# Patient Record
Sex: Female | Born: 2000 | Race: White | Hispanic: No | Marital: Single | Smoking: Never smoker
Health system: Southern US, Community
[De-identification: ages and names within clinical notes are randomized; demographics above are authoritative.]

## PROBLEM LIST (undated history)

## (undated) DIAGNOSIS — F419 Anxiety disorder, unspecified: Secondary | ICD-10-CM

## (undated) DIAGNOSIS — F32A Depression, unspecified: Secondary | ICD-10-CM

## (undated) DIAGNOSIS — F329 Major depressive disorder, single episode, unspecified: Secondary | ICD-10-CM

## (undated) DIAGNOSIS — Z9109 Other allergy status, other than to drugs and biological substances: Secondary | ICD-10-CM

## (undated) DIAGNOSIS — F909 Attention-deficit hyperactivity disorder, unspecified type: Secondary | ICD-10-CM

## (undated) HISTORY — DX: Anxiety disorder, unspecified: F41.9

## (undated) HISTORY — DX: Major depressive disorder, single episode, unspecified: F32.9

## (undated) HISTORY — DX: Depression, unspecified: F32.A

---
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
## 2018-02-02 ENCOUNTER — Ambulatory Visit (INDEPENDENT_AMBULATORY_CARE_PROVIDER_SITE_OTHER): Payer: 59 | Admitting: Physician Assistant

## 2018-02-02 VITALS — BP 101/59 | HR 86

## 2018-02-02 DIAGNOSIS — Z3042 Encounter for surveillance of injectable contraceptive: Secondary | ICD-10-CM

## 2018-02-02 MED ORDER — ESTROGENS CONJUGATED 1.25 MG PO TABS: ORAL_TABLET | ORAL | 0 refills | Status: DC

## 2018-02-02 MED ORDER — MEDROXYPROGESTERONE ACETATE 150 MG/ML IM SUSP
150.0000 mg | Freq: Once | INTRAMUSCULAR | Status: AC
  Administered 2018-02-02: 150 mg via INTRAMUSCULAR

## 2018-02-02 MED FILL — PREMARIN 1.25 MG TABLET: 1.25 | 10 days supply | Qty: 40 | Fill #0

## 2018-02-02 NOTE — Progress Notes (Signed)
Pt came into clinic today for depo injection, she is within administration window. Reports over the last month she has had 2 episodes of menstruation, she is currently bleeding now. Requesting something to help stop her period because she is leaving on a cruise tomorrow. Provider will send over estrogen Rx. Pt tolerated injection in LUOQ well, no immediate complications. Pt BP is also low. Provided water and let her sit for 5 min after injection. Pt is asymptomatic so advised to leave clinic and go get something salty to eat and a gatorade. Verbalized understanding. Print out of next vaccines dates provided. No further questions.   Agree with above plan. Follow up with PCP if bleeding persists. Tandy GawJade Breeback PA-C

## 2018-02-06 ENCOUNTER — Ambulatory Visit: Payer: 59

## 2018-03-29 MED FILL — VENTOLIN HFA 90 MCG INHALER: 108 (90 BAS | 17 days supply | Qty: 18 | Fill #1

## 2018-04-13 ENCOUNTER — Ambulatory Visit: Payer: 59

## 2018-04-20 ENCOUNTER — Ambulatory Visit: Payer: 59

## 2018-04-30 ENCOUNTER — Telehealth: Payer: Self-pay | Admitting: Family Medicine

## 2018-04-30 ENCOUNTER — Ambulatory Visit (INDEPENDENT_AMBULATORY_CARE_PROVIDER_SITE_OTHER): Payer: 59 | Admitting: Family Medicine

## 2018-04-30 VITALS — BP 105/55 | HR 72 | Temp 98.1°F

## 2018-04-30 DIAGNOSIS — Z3042 Encounter for surveillance of injectable contraceptive: Secondary | ICD-10-CM

## 2018-04-30 DIAGNOSIS — Z23 Encounter for immunization: Secondary | ICD-10-CM | POA: Diagnosis not present

## 2018-04-30 MED ORDER — MEDROXYPROGESTERONE ACETATE 150 MG/ML IM SUSP
150.0000 mg | Freq: Once | INTRAMUSCULAR | Status: AC
  Administered 2018-04-30: 150 mg via INTRAMUSCULAR

## 2018-04-30 MED ORDER — ALBUTEROL SULFATE 108 (90 BASE) MCG/ACT IN AEPB: 2.0000 | INHALATION_SPRAY | RESPIRATORY_TRACT | 1 refills | Status: DC | PRN

## 2018-04-30 MED ORDER — LISDEXAMFETAMINE DIMESYLATE 40 MG PO CAPS: 40.0000 mg | ORAL_CAPSULE | ORAL | 0 refills | Status: DC

## 2018-04-30 MED FILL — VENTOLIN HFA 90 MCG INHALER: 108 (90 BAS | 17 days supply | Qty: 18 | Fill #2

## 2018-04-30 NOTE — Progress Notes (Signed)
Pt's mother advised. 

## 2018-04-30 NOTE — Telephone Encounter (Signed)
Received fax from [employer] that Vyvanse requires a PA. Information has been sent to the insurance company. Awaiting determination.   

## 2018-04-30 NOTE — Progress Notes (Signed)
I will go ahead and refill vyvansse since BP ok today. Will need to F/U in Dec when home for break.

## 2018-04-30 NOTE — Progress Notes (Signed)
Pt came into clinic today for depo injection, she is within administration window. Reports no negative side effects. Pt tolerated depo injection in LUOQ well, no immediate complications. Pt tolerated flu shot in left deltoid well, no immediate complications. Pt BP is low, as it has been the last 3 office visits. Pt is asymptomatic so advised to leave clinic and go get something salty to eat and a gatorade. Verbalized understanding. Print out of next vaccines dates provided.   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 She took a year off school last year, so she stopped taking her Vyvanse. She now would like to restart it. Also requesting a refill on her inhaler. Routing to PCP.

## 2018-05-03 NOTE — Telephone Encounter (Signed)
Call pt: Just verify that she is okay with this.  Just do not want her to be surprised when tshe gets her medication.

## 2018-05-03 NOTE — Telephone Encounter (Signed)
Received fax from [employer] that Vyvanse is not covered and patient will have to try and fail Adderall or Adderall XR. Please advise.

## 2018-05-03 NOTE — Telephone Encounter (Signed)
Left message for patient to call back about medication change.  

## 2018-05-04 NOTE — Telephone Encounter (Signed)
Left brief VM for patient to call back about medication change.

## 2018-05-07 NOTE — Telephone Encounter (Signed)
MyChart message sent to the patient and waiting on response from patient since I have not received a call back.

## 2018-05-14 NOTE — Telephone Encounter (Signed)
Left vm informing pt to rtn call about taking Adderall or Adderall XR. Or she could respond via my chart.Marland KitchenMarland KitchenHeath Gold, CMA

## 2018-06-07 ENCOUNTER — Telehealth: Payer: 59 | Admitting: Physician Assistant

## 2018-06-07 DIAGNOSIS — J45909 Unspecified asthma, uncomplicated: Secondary | ICD-10-CM

## 2018-06-07 MED ORDER — ALBUTEROL SULFATE 108 (90 BASE) MCG/ACT IN AEPB: 2.0000 | INHALATION_SPRAY | RESPIRATORY_TRACT | 0 refills | Status: DC | PRN

## 2018-06-07 MED ORDER — BENZONATATE 100 MG PO CAPS: 100.0000 mg | ORAL_CAPSULE | Freq: Three times a day (TID) | ORAL | 0 refills | Status: DC | PRN

## 2018-06-07 MED ORDER — PREDNISONE 10 MG PO TABS
10.0000 mg | ORAL_TABLET | Freq: Every day | ORAL | 0 refills | Status: DC
Start: 2018-06-07 — End: 2018-07-12

## 2018-06-07 MED ORDER — BECLOMETHASONE DIPROPIONATE 40 MCG/ACT IN AERS: 2.0000 | INHALATION_SPRAY | Freq: Two times a day (BID) | RESPIRATORY_TRACT | 0 refills | Status: DC

## 2018-06-07 NOTE — Progress Notes (Signed)

## 2018-07-12 ENCOUNTER — Telehealth: Payer: Self-pay

## 2018-07-12 MED ORDER — AMPHETAMINE-DEXTROAMPHET ER 30 MG PO CP24: 30.0000 mg | ORAL_CAPSULE | Freq: Every day | ORAL | 0 refills | Status: DC

## 2018-07-12 MED FILL — ADDERALL XR 30 MG CAP SA: 30 | 30 days supply | Qty: 30 | Fill #0

## 2018-07-12 NOTE — Telephone Encounter (Signed)
New prescription for extended release Adderall 30 mg is sent to [facility].  This is actually pretty close to 40 mg of Vyvanse even though the milligrams are different.  She can try it for a month and let me know if this is working and helpful.  She still prefers the Vyvanse and she may want to speak with her insurance company to see if she is able to request a exemption.

## 2018-07-12 NOTE — Telephone Encounter (Signed)
Vyvanse is not covered by insurance. She has to be on Adderall first. Patient agreed to try the Adderall. Please advise.

## 2018-07-12 NOTE — Telephone Encounter (Signed)
lvm w/recommendations. Advised to call back if any questions.[name].[name].Shannon PacasBarkley, Vallery Mcdade KekoskeeLynetta

## 2018-07-16 ENCOUNTER — Ambulatory Visit: Payer: 59

## 2018-07-18 MED FILL — PROAIR RESPICLICK INHAL PWD: 108 (90 BAS | 17 days supply | Qty: 1 | Fill #0

## 2018-07-31 ENCOUNTER — Ambulatory Visit (INDEPENDENT_AMBULATORY_CARE_PROVIDER_SITE_OTHER): Payer: 59 | Admitting: Family Medicine

## 2018-07-31 VITALS — BP 106/64 | HR 73 | Temp 98.2°F | Wt 130.0 lb

## 2018-07-31 DIAGNOSIS — Z3042 Encounter for surveillance of injectable contraceptive: Secondary | ICD-10-CM

## 2018-07-31 LAB — POCT URINE PREGNANCY: Preg Test, Ur: NEGATIVE

## 2018-07-31 MED ORDER — MEDROXYPROGESTERONE ACETATE 150 MG/ML IM SUSP
150.0000 mg | Freq: Once | INTRAMUSCULAR | Status: AC
  Administered 2018-07-31: 150 mg via INTRAMUSCULAR

## 2018-07-31 NOTE — Progress Notes (Signed)
Pt is here for a Depo Provera injection. Denies chest pain, shortness of breath, headaches, mood changes or problems with medication. Pt is not within shot window for administration so Urine HCG performed and was negative.Pt.tolerated injection in  LUOQ well, no immediate complications. Printed copy of next injection window provided. No further questions or concerns.

## 2018-07-31 NOTE — Progress Notes (Signed)
Agree with documentation as above.  Need to get an up-to-date urine GC chlamydia on her as well.  Nani Gasser, MD

## 2018-07-31 NOTE — Progress Notes (Signed)
Urine was discarded, will order at next visit.

## 2018-08-16 MED FILL — PROAIR RESPICLICK INHAL PWD: 108 (90 BAS | 17 days supply | Qty: 1 | Fill #1

## 2018-10-10 ENCOUNTER — Other Ambulatory Visit: Payer: Self-pay | Admitting: Family Medicine

## 2018-10-11 MED FILL — PROAIR RESPICLICK INHAL PWD: 108 (90 BAS | 17 days supply | Qty: 1 | Fill #0

## 2018-10-23 ENCOUNTER — Ambulatory Visit (INDEPENDENT_AMBULATORY_CARE_PROVIDER_SITE_OTHER): Payer: 59 | Admitting: Osteopathic Medicine

## 2018-10-23 VITALS — BP 115/55 | HR 84

## 2018-10-23 DIAGNOSIS — Z3042 Encounter for surveillance of injectable contraceptive: Secondary | ICD-10-CM

## 2018-10-23 MED ORDER — MEDROXYPROGESTERONE ACETATE 150 MG/ML IM SUSP
150.0000 mg | Freq: Once | INTRAMUSCULAR | Status: AC
  Administered 2018-10-23: 09:00:00 150 mg via INTRAMUSCULAR

## 2018-10-23 NOTE — Progress Notes (Signed)
Established Patient Office Visit  Subjective:  Patient ID: Shannon Bishop, female    DOB: [birth year]  Age: 18 y.o. MRN: [id number]  CC:  Chief Complaint  Patient presents with  . Contraception    HPI Shannon Bishop is here for a Depo Provera injection. Denies chest pain, shortness of breath, headaches, mood changes or problems with medication. It has been < 14 weeks since her last Depo Provera injection.   Past Medical History:  Diagnosis Date  . ADHD (attention deficit hyperactivity disorder)   . Anxiety   . Asthma   . Depression   . Environmental allergies     No past surgical history on file.  Family History  Problem Relation Age of Onset  . Asthma Mother   . Irritable bowel syndrome Mother   . Hypertension Maternal Grandmother   . Heart disease Maternal Grandmother   . Cancer Maternal Grandmother   . Irritable bowel syndrome Sister     Social History   Socioeconomic History  . Marital status: Single    Spouse name: Not on file  . Number of children: Not on file  . Years of education: Not on file  . Highest education level: Not on file  Occupational History  . Not on file  Social Needs  . Financial resource strain: Not on file  . Food insecurity:    Worry: Not on file    Inability: Not on file  . Transportation needs:    Medical: Not on file    Non-medical: Not on file  Tobacco Use  . Smoking status: Never Smoker  . Smokeless tobacco: Never Used  Substance and Sexual Activity  . Alcohol use: No  . Drug use: No  . Sexual activity: Yes    Birth control/protection: Pill  Lifestyle  . Physical activity:    Days per week: Not on file    Minutes per session: Not on file  . Stress: Not on file  Relationships  . Social connections:    Talks on phone: Not on file    Gets together: Not on file    Attends religious service: Not on file    Active member of club or organization: Not on file    Attends meetings of clubs or organizations: Not on file     Relationship status: Not on file  . Intimate partner violence:    Fear of current or ex partner: Not on file    Emotionally abused: Not on file    Physically abused: Not on file    Forced sexual activity: Not on file  Other Topics Concern  . Not on file  Social History Narrative  . Not on file    Outpatient Medications Prior to Visit  Medication Sig Dispense Refill  . albuterol (PROVENTIL) (2.5 MG/3ML) 0.083% nebulizer solution Take 3 mLs (2.5 mg total) by nebulization every 4 (four) hours as needed for wheezing or shortness of breath (please include nebulizer machine, hoses, and mask if needed.). 30 vial 0  . amphetamine-dextroamphetamine (ADDERALL XR) 30 MG 24 hr capsule Take 1 capsule (30 mg total) by mouth daily. 30 capsule 0  . beclomethasone (QVAR) 40 MCG/ACT inhaler Inhale 2 puffs into the lungs 2 (two) times daily. 1 Inhaler 0  . lisdexamfetamine (VYVANSE) 40 MG capsule Take 1 capsule (40 mg total) by mouth every morning. 30 capsule 0  . Melatonin 5 MG TABS Take 1 tablet by mouth at bedtime as needed (sleep).    [name] PROAIR RESPICLICK  108 (90 Base) MCG/ACT AEPB INHALE 2 PUFFS INTO THE LUNGS EVERY 4 (FOUR) HOURS AS NEEDED. 1 each 1   No facility-administered medications prior to visit.     Allergies  Allergen Reactions  . Lexapro [Escitalopram Oxalate] Nausea And Vomiting  . Amoxicillin Rash  . Trintellix [Vortioxetine] Nausea And Vomiting    ROS Review of Systems    Objective:    Physical Exam  BP (!) 115/55   Pulse 84   SpO2 100%  Wt Readings from Last 3 Encounters:  07/31/18 130 lb (59 kg)  12/23/17 120 lb (54.4 kg) (35 %, Z= -0.38)*  11/21/17 126 lb (57.2 kg) (48 %, Z= -0.06)*   * Growth percentiles are based on CDC (Girls, 2-20 Years) data.     Health Maintenance Due  Topic Date Due  . HIV Screening  07/18/2013  . CHLAMYDIA SCREENING  05/30/2018    There are no preventive care reminders to display for this patient.  Lab Results  Component Value  Date   TSH 1.34 11/26/2015   Lab Results  Component Value Date   WBC 13.8 (H) 12/23/2017   HGB 14.7 12/23/2017   HCT 40.4 12/23/2017   MCV 88.2 12/23/2017   PLT 334 12/23/2017   Lab Results  Component Value Date   NA 141 12/23/2017   K 4.3 12/23/2017   CO2 23 12/23/2017   GLUCOSE 98 12/23/2017   BUN 13 12/23/2017   CREATININE 0.83 12/23/2017   BILITOT 1.1 12/23/2017   ALKPHOS 60 12/23/2017   AST 26 12/23/2017   ALT 17 12/23/2017   PROT 8.1 12/23/2017   ALBUMIN 5.0 12/23/2017   CALCIUM 9.6 12/23/2017   ANIONGAP 11 12/23/2017   No results found for: CHOL No results found for: HDL No results found for: LDLCALC No results found for: TRIG No results found for: CHOLHDL No results found for: [id number]    Assessment & Plan:  Contraceptive - Patient tolerated injection well without complications. Patient advised to schedule next injection 12 weeks from today.   Problem List Items Addressed This Visit    None    Visit Diagnoses    Surveillance of contraceptive injection    -  Primary   Relevant Medications   medroxyPROGESTERone (DEPO-PROVERA) injection 150 mg (Completed) (Start on 10/23/2018  9:15 AM)      Meds ordered this encounter  Medications  . medroxyPROGESTERone (DEPO-PROVERA) injection 150 mg    Follow-up: Return in about 12 weeks (around 01/15/2019) for Depo Provera injection. Earna Coder.    Meklit Cotta, Janalyn HarderAngela Hale, CMA

## 2018-12-03 ENCOUNTER — Telehealth: Payer: Self-pay

## 2018-12-03 ENCOUNTER — Encounter: Payer: Self-pay | Admitting: Family Medicine

## 2018-12-03 NOTE — Telephone Encounter (Signed)
Shannon Bishop called and reports close contact with someone who tested positive for the COVID-19. (Her boyfriend) Patient has no symptoms at the moment. Patient advised to self quarantine for 48 hours. If symptoms such as cough, shortness of breath and fever give [place] a call back.

## 2018-12-03 NOTE — Telephone Encounter (Signed)
Agree with above 

## 2018-12-04 ENCOUNTER — Telehealth (INDEPENDENT_AMBULATORY_CARE_PROVIDER_SITE_OTHER): Payer: 59 | Admitting: Family Medicine

## 2018-12-04 ENCOUNTER — Telehealth: Payer: Self-pay | Admitting: Family Medicine

## 2018-12-04 ENCOUNTER — Telehealth: Payer: 59 | Admitting: Family Medicine

## 2018-12-04 ENCOUNTER — Other Ambulatory Visit: Payer: 59

## 2018-12-04 ENCOUNTER — Telehealth: Payer: Self-pay | Admitting: *Deleted

## 2018-12-04 ENCOUNTER — Other Ambulatory Visit: Payer: Self-pay

## 2018-12-04 ENCOUNTER — Encounter: Payer: Self-pay | Admitting: Family Medicine

## 2018-12-04 VITALS — Temp 98.7°F | Wt 126.0 lb

## 2018-12-04 DIAGNOSIS — Z20822 Contact with and (suspected) exposure to covid-19: Secondary | ICD-10-CM

## 2018-12-04 DIAGNOSIS — J4540 Moderate persistent asthma, uncomplicated: Secondary | ICD-10-CM | POA: Diagnosis not present

## 2018-12-04 DIAGNOSIS — R6889 Other general symptoms and signs: Secondary | ICD-10-CM | POA: Diagnosis not present

## 2018-12-04 MED ORDER — ALBUTEROL SULFATE HFA 108 (90 BASE) MCG/ACT IN AERS: 2.0000 | INHALATION_SPRAY | Freq: Four times a day (QID) | RESPIRATORY_TRACT | 2 refills | Status: DC | PRN

## 2018-12-04 MED FILL — ALBUTEROL SULFATE HFA 108 (: 108 (90 BAS | 25 days supply | Qty: 18 | Fill #0

## 2018-12-04 NOTE — Progress Notes (Signed)
Virtual Visit via Video Note  I connected with Shannon Bishop on 12/04/18 at 11:30 AM EDT by a video enabled telemedicine application and verified that I am speaking with the correct person using two identifiers.   I discussed the limitations of evaluation and management by telemedicine and the availability of in person appointments. The patient expressed understanding and agreed to proceed.  Pt was at home and I was in my office for the virtual visit.      Subjective:    CC:   HPI:          Possible COVID-19 exposure. Her boyfriend was tested and got his results back and was positive on yesterday. And she was last with him on Thursday of last week.  She reports dry cough, runny nose and sore throat x 1 day that are progressively getting worse. She has been taking IBU and tylenol for her sxs only 1 day. + frontal HA.  Denies any nausea or diarrhea, no spikes in her temperature. + bodyaches and mild congestion. No oss of smell. She has had some wheezing and shortness of breath.   She has been quarantining since Friday.                Past medical history, Surgical history, Family history not pertinant except as noted below, Social history, Allergies, and medications have been entered into the medical record, reviewed, and corrections made.   Review of Systems: No fevers, chills, night sweats, weight loss, chest pain, or shortness of breath.   Objective:    General: Speaking clearly in complete sentences without any shortness of breath.  Alert and oriented x3.  Normal judgment. No apparent acute distress. Sitting in bed.     Impression and Recommendations:   Covid suspect -try to see if we can get her tested today.  She is young and otherwise healthy but does have asthma which does increase her risk. she is self quarantining currently.  Discussed that she will need to self quarantine the most 7 days.  She can call back if she feels like she is getting worse or  developing any new symptoms.  I am also going to send over the COVID logging system through my chart to track her symptoms.  Encouraged her to stay well-hydrated.       I discussed the assessment and treatment plan with the patient. The patient was provided an opportunity to ask questions and all were answered. The patient agreed with the plan and demonstrated an understanding of the instructions.   The patient was advised to call back or seek an in-person evaluation if the symptoms worsen or if the condition fails to improve as anticipated.   Nani Gasser, MD

## 2018-12-04 NOTE — Telephone Encounter (Signed)
Contacted both numbers on file and person who anwsered said they were on a business call and could not talk right now, when asked if this was or if they knew a Wynelle Fanny, they said "No you have the wrong number".

## 2018-12-04 NOTE — Telephone Encounter (Signed)
-----   Message from Agapito Games, MD sent at 12/04/2018 11:55 AM EDT ----- Regarding: test for COVID Please schedule patient for community testing.  She was around her boyfriend last week who is just tested positive for COVID and she is now symptomatic herself.  She does have underlying asthma.

## 2018-12-04 NOTE — Progress Notes (Signed)
Possible COVID-19 exposure. Her boyfriend was tested and got his results back and was positive on yesterday. And she was last with him on Thursday of last week.  She reports cough, runny nose and cough x 1 day that are progressively getting worse. She has been taking IBU and tylenol for her sxs only 1 day. Denies any body aches nausea or diarrhea, no spikes in her temperature.   She has been quarantining since Friday.

## 2018-12-04 NOTE — Telephone Encounter (Signed)
Got correct number and fixed it on patients chart. No further questions at this time.

## 2018-12-04 NOTE — Telephone Encounter (Signed)
Patient called and scheduled for testing at 1:45pm on 12/04/18 at the [facility] site. Pt advised to wear a mask and remain in car for appt. Understanding verbalized.

## 2018-12-05 LAB — NOVEL CORONAVIRUS, NAA: SARS-CoV-2, NAA: NOT DETECTED

## 2018-12-10 ENCOUNTER — Encounter: Payer: Self-pay | Admitting: Family Medicine

## 2018-12-10 DIAGNOSIS — F331 Major depressive disorder, recurrent, moderate: Secondary | ICD-10-CM

## 2018-12-11 MED ORDER — ESCITALOPRAM OXALATE 10 MG PO TABS
ORAL_TABLET | ORAL | 0 refills | Status: DC
Start: 2018-12-11 — End: 2019-01-08

## 2018-12-11 MED FILL — ESCITALOPRAM 10 MG TABLET: 10 | 30 days supply | Qty: 30 | Fill #0

## 2019-01-08 ENCOUNTER — Ambulatory Visit (INDEPENDENT_AMBULATORY_CARE_PROVIDER_SITE_OTHER): Payer: 59 | Admitting: Family Medicine

## 2019-01-08 VITALS — BP 106/62 | HR 68 | Temp 97.6°F | Ht 62.0 in | Wt 130.0 lb

## 2019-01-08 DIAGNOSIS — Z3042 Encounter for surveillance of injectable contraceptive: Secondary | ICD-10-CM | POA: Diagnosis not present

## 2019-01-08 DIAGNOSIS — F331 Major depressive disorder, recurrent, moderate: Secondary | ICD-10-CM

## 2019-01-08 MED ORDER — QVAR 40 MCG/ACT IN AERS: 2.0000 | INHALATION_SPRAY | Freq: Two times a day (BID) | RESPIRATORY_TRACT | 1 refills | Status: DC

## 2019-01-08 MED ORDER — ESCITALOPRAM OXALATE 10 MG PO TABS: ORAL_TABLET | ORAL | 1 refills | Status: DC

## 2019-01-08 MED ORDER — MEDROXYPROGESTERONE ACETATE 150 MG/ML IM SUSP
150.0000 mg | Freq: Once | INTRAMUSCULAR | Status: AC
  Administered 2019-01-08: 150 mg via INTRAMUSCULAR

## 2019-01-08 MED FILL — ESCITALOPRAM 10 MG TABLET: 10 | 30 days supply | Qty: 30 | Fill #0

## 2019-01-08 MED FILL — QVAR REDIHALER 40 MCG/ACT A: 40 | 30 days supply | Qty: 11 | Fill #0

## 2019-01-08 NOTE — Progress Notes (Signed)
Patient presents to clinic for Depo Provera injection less than 12 weeks since the last injection. Patient denies chest pain, shortness of breath, headaches, mood changes or any other problems associated with the medication. Patient was given the injection in [place]. This is the correct placement.  Patient was given a handout and asked to schedule in 12 weeks from today. No other inquiries during the visit.

## 2019-01-14 MED FILL — ALBUTEROL SULFATE HFA 108 (: 108 (90 BAS | 25 days supply | Qty: 18 | Fill #1

## 2019-02-04 MED FILL — ALBUTEROL SULFATE HFA 108 (: 108 (90 BAS | 25 days supply | Qty: 18 | Fill #2

## 2019-02-22 ENCOUNTER — Encounter: Payer: Self-pay | Admitting: Family Medicine

## 2019-02-22 DIAGNOSIS — F331 Major depressive disorder, recurrent, moderate: Secondary | ICD-10-CM

## 2019-02-22 MED ORDER — ESCITALOPRAM OXALATE 20 MG PO TABS: 20.0000 mg | ORAL_TABLET | Freq: Every day | ORAL | 0 refills | Status: DC

## 2019-02-25 MED FILL — ESCITALOPRAM 20 MG TABLET: 20 | 90 days supply | Qty: 90 | Fill #0

## 2019-03-13 ENCOUNTER — Ambulatory Visit (INDEPENDENT_AMBULATORY_CARE_PROVIDER_SITE_OTHER): Payer: 59 | Admitting: Physician Assistant

## 2019-03-13 ENCOUNTER — Other Ambulatory Visit: Payer: Self-pay

## 2019-03-13 ENCOUNTER — Encounter: Payer: Self-pay | Admitting: Physician Assistant

## 2019-03-13 VITALS — BP 107/58 | HR 83 | Ht 62.0 in | Wt 129.0 lb

## 2019-03-13 DIAGNOSIS — R21 Rash and other nonspecific skin eruption: Secondary | ICD-10-CM

## 2019-03-13 DIAGNOSIS — L299 Pruritus, unspecified: Secondary | ICD-10-CM | POA: Diagnosis not present

## 2019-03-13 MED ORDER — HYDROXYZINE PAMOATE 25 MG PO CAPS: 25.0000 mg | ORAL_CAPSULE | Freq: Four times a day (QID) | ORAL | 0 refills | Status: DC | PRN

## 2019-03-13 MED ORDER — PREDNISONE 20 MG PO TABS
ORAL_TABLET | ORAL | 0 refills | Status: DC
Start: 2019-03-13 — End: 2019-04-03

## 2019-03-13 MED ORDER — METHYLPREDNISOLONE SODIUM SUCC 125 MG IJ SOLR
125.0000 mg | Freq: Once | INTRAMUSCULAR | Status: AC
  Administered 2019-03-13: 125 mg via INTRAMUSCULAR

## 2019-03-13 MED FILL — predniSONE 20 MG TABS: 20 | 13 days supply | Qty: 20 | Fill #0

## 2019-03-13 MED FILL — HYDROXYZINE PAM 25 MG CAP: 25 | 15 days supply | Qty: 60 | Fill #0

## 2019-03-13 NOTE — Progress Notes (Signed)
Subjective:    Patient ID: Shannon Bishop, female    DOB: 2001-01-20, 18 y.o.   MRN: [id number]  HPI  Pt is a 18 yo female with asthma and seasonal allergies who presents to the clinic with rash on bilateral flank and lower abdomen and some on arms/elbows for the last week. She is not aware of any new medications or environments. She did just go up on her lexapro dosage about 2 weeks ago. The first time she start lexapro she also had n/v but she tried again and was better with that. She has not started using any new detergents and uses fragrence free detergent now. She is VERY itchy but no pain. No SOB, tongue swelling, lip swelling, problems swallowing. Showers seems to soothe for a little bit. Not taking any daily allergy medication.   .. Active Ambulatory Problems    Diagnosis Date Noted  . Chronic seasonal allergic rhinitis 03/15/2010  . ADHD (attention deficit hyperactivity disorder) 06/29/2012  . Major depression 11/26/2015  . Generalized anxiety disorder 01/07/2016  . Menorrhagia with regular cycle 05/10/2017  . Skin hypopigmentation 05/10/2017  . Asthma in adult, moderate persistent, uncomplicated 12/04/2018   Resolved Ambulatory Problems    Diagnosis Date Noted  . ASTHMA 03/15/2010  . PNEUMONIA, RIGHT 08/25/2010  . ADVERSE DRUG REACTION 08/25/2010  . Sprain, lateral patellar retinaculum of the left knee. 04/04/2012  . Asthma exacerbation 10/06/2013  . Right knee pain 06/09/2015  . Laceration, eyelid, left 07/27/2015  . Major depressive disorder, recurrent episode, moderate (HCC) 01/07/2016  . Viral upper respiratory infection 04/06/2016  . Rhomboid muscle pain 06/15/2016  . Abdominal pain 11/21/2016   Past Medical History:  Diagnosis Date  . Anxiety   . Asthma   . Depression   . Environmental allergies      Review of Systems See hPI.     Objective:   Physical Exam Vitals signs reviewed.  Constitutional:      Appearance: Normal appearance.  Cardiovascular:      Rate and Rhythm: Normal rate.     Pulses: Normal pulses.  Pulmonary:     Effort: Pulmonary effort is normal.     Breath sounds: Normal breath sounds. No wheezing.  Neurological:     General: No focal deficit present.     Mental Status: She is alert and oriented to person, place, and time.  Psychiatric:        Mood and Affect: Mood normal.          large erythematous base with defined border and appearance of wheals most promient on bilateral flank but on bilateral elbows, lower abdomen.   Assessment & Plan:  [name].[name].Shannon Bishop was seen today for rash.  Diagnoses and all orders for this visit:  Rash -     predniSONE (DELTASONE) 20 MG tablet; Take 3 tablets for 3 days, take 2 tablets for 3 days, take 1 tablet for 3 days, take 1/2 tablet for 4 days. -     hydrOXYzine (VISTARIL) 25 MG capsule; Take 1 capsule (25 mg total) by mouth every 6 (six) hours as needed. -     methylPREDNISolone sodium succinate (SOLU-MEDROL) 125 mg/2 mL injection 125 mg  Itching -     hydrOXYzine (VISTARIL) 25 MG capsule; Take 1 capsule (25 mg total) by mouth every 6 (six) hours as needed.   Pt did just go up on lexapro dose. Rash is certainly more widespread than what contact dermatitis usually appears like. I would stop lexapro. Given solumedrol  today. Prednisone taper to start. Vistaril for itching. Watch out for sedation.   Follow up in 1-2 weeks. Need to discuss going back on some anti-depressant once rash is figured out.

## 2019-03-13 NOTE — Patient Instructions (Signed)
Hives Hives are itchy, red, swollen areas on your skin. Hives can show up on any part of your body. Hives often fade within 24 hours (acute hives). New hives can show up after old ones fade. This can go on for many days or weeks (chronic hives). Hives do not spread from person to person (are not contagious). Hives are caused by your body's response to something that you are allergic to (allergen). These are sometimes called triggers. You can get hives right after being around a trigger, or hours later. What are the causes?  Allergies to foods.  Insect bites or stings.  Pollen.  Pets.  Latex.  Chemicals.  Spending time in sunlight, heat, or cold.  Exercise.  Stress.  Some medicines.  Viruses. This includes the common cold.  Infections caused by germs (bacteria).  Allergy shots.  Blood transfusions. Sometimes, the cause is not known. What increases the risk?  Being a woman.  Being allergic to foods such as: ? Citrus fruits. ? Milk. ? Eggs. ? Peanuts. ? Tree nuts. ? Shellfish.  Being allergic to: ? Medicines. ? Latex. ? Insects. ? Animals. ? Pollen. What are the signs or symptoms?   Raised, itchy, red or white bumps or patches on your skin. These areas may: ? Get large and swollen. ? Change in shape and location. ? Stand alone or connect to each other over a large area of skin. ? Sting or hurt. ? Turn white when pressed in the center (blanch). In very bad cases, your hands, feet, and face may also get swollen. This may happen if hives start deeper in your skin. How is this treated? Treatment for this condition depends on your symptoms. Treatment may include:  Using cool, wet cloths (cool compresses) or taking cool showers to stop the itching.  Medicines that help: ? Relieve itching (antihistamines). ? Reduce swelling (corticosteroids). ? Treat infection (antibiotics).  A medicine (omalizumab) that is given as a shot (injection). Your doctor may  prescribe this if you have hives that do not get better even after other treatments.  In very bad cases, you may need a shot of a medicine called epinephrineto prevent a life-threatening allergic reaction (anaphylaxis). Follow these instructions at home: Medicines  Take or apply over-the-counter and prescription medicines only as told by your doctor.  If you were prescribed an antibiotic medicine, use it as told by your doctor. Do not stop using it even if you start to feel better. Skin care  Apply cool, wet cloths to the hives.  Do not scratch your skin. Do not rub your skin. General instructions  Do not take hot showers or baths. This can make itching worse.  Do not wear tight clothes.  Use sunscreen and wear clothes that cover your skin when you are outside.  Avoid any triggers that cause your hives. Keep a journal to help track what causes your hives. Write down: ? What medicines you take. ? What you eat and drink. ? What products you use on your skin.  Keep all follow-up visits as told by your doctor. This is important. Contact a doctor if:  Your symptoms are not better with medicine.  Your joints hurt or are swollen. Get help right away if:  You have a fever.  You have pain in your belly (abdomen).  Your tongue or lips are swollen.  Your eyelids are swollen.  Your chest or throat feels tight.  You have trouble breathing or swallowing. These symptoms may be an emergency.   Do not wait to see if the symptoms will go away. Get medical help right away. Call your local emergency services (911 in the [place] Do not drive yourself to the hospital. Summary  Hives are itchy, red, swollen areas on your skin.  Treatment for this condition depends on your symptoms.  Avoid things that cause your hives. Keep a journal to help track what causes your hives.  Take and apply over-the-counter and prescription medicines only as told by your doctor.  Keep all follow-up visits  as told by your doctor. This is important. This information is not intended to replace advice given to you by your health care provider. Make sure you discuss any questions you have with your health care provider. Document Released: 03/29/2008 Document Revised: 01/03/2018 Document Reviewed: 01/03/2018 Elsevier Patient Education  2020 Elsevier Inc.  

## 2019-03-26 ENCOUNTER — Encounter: Payer: Self-pay | Admitting: Family Medicine

## 2019-03-26 ENCOUNTER — Other Ambulatory Visit: Payer: Self-pay

## 2019-03-26 ENCOUNTER — Ambulatory Visit (INDEPENDENT_AMBULATORY_CARE_PROVIDER_SITE_OTHER): Payer: 59 | Admitting: Family Medicine

## 2019-03-26 VITALS — BP 107/60 | HR 87 | Temp 98.4°F | Wt 137.1 lb

## 2019-03-26 DIAGNOSIS — Z3042 Encounter for surveillance of injectable contraceptive: Secondary | ICD-10-CM

## 2019-03-26 MED ORDER — MEDROXYPROGESTERONE ACETATE 150 MG/ML IM SUSP
150.0000 mg | Freq: Once | INTRAMUSCULAR | Status: AC
  Administered 2019-03-26: 150 mg via INTRAMUSCULAR

## 2019-03-26 NOTE — Progress Notes (Signed)
Pt is here for Depo Provera injection. Denies chest pain, SOB, headaches, mood changes or problems well. Injection administered in the LUOQ. Patient tolerated injection well without complications. Patient was advised to schedule next injection in 12 weeks (Depo Provera calendar handed to patient).

## 2019-03-26 NOTE — Progress Notes (Signed)
Agree with documentation as above.   Dwight Adamczak, MD  

## 2019-04-03 ENCOUNTER — Other Ambulatory Visit: Payer: Self-pay

## 2019-04-03 ENCOUNTER — Ambulatory Visit (INDEPENDENT_AMBULATORY_CARE_PROVIDER_SITE_OTHER): Payer: 59 | Admitting: Family Medicine

## 2019-04-03 ENCOUNTER — Encounter: Payer: Self-pay | Admitting: Family Medicine

## 2019-04-03 VITALS — BP 97/54 | HR 94 | Ht 62.0 in | Wt 130.0 lb

## 2019-04-03 DIAGNOSIS — Z23 Encounter for immunization: Secondary | ICD-10-CM | POA: Diagnosis not present

## 2019-04-03 DIAGNOSIS — R21 Rash and other nonspecific skin eruption: Secondary | ICD-10-CM

## 2019-04-03 MED ORDER — CETIRIZINE HCL 10 MG PO TABS: 10.0000 mg | ORAL_TABLET | Freq: Every day | ORAL | 2 refills | Status: DC

## 2019-04-03 MED ORDER — LORATADINE 10 MG PO TABS: 10.0000 mg | ORAL_TABLET | Freq: Every day | ORAL | 2 refills | Status: DC

## 2019-04-03 MED ORDER — TRIAMCINOLONE ACETONIDE 0.1 % EX CREA: 1.0000 "application " | TOPICAL_CREAM | Freq: Two times a day (BID) | CUTANEOUS | 1 refills | Status: DC

## 2019-04-03 MED FILL — TRIAMCINOLONE ACETONIDE 0.1: 0.1 | 30 days supply | Qty: 80 | Fill #0

## 2019-04-03 MED FILL — LORATADINE 10 MG TABLET: 10 | 100 days supply | Qty: 100 | Fill #0

## 2019-04-03 MED FILL — CETIRIZINE HCL 10 MG TABS: 10 | 100 days supply | Qty: 100 | Fill #0

## 2019-04-03 NOTE — Progress Notes (Signed)
Acute Office Visit  Subjective:    Patient ID: Shannon Bishop, female    DOB: 2000/10/18, 18 y.o.   MRN: [id number]  Chief Complaint  Patient presents with  . Rash    HPI Patient is in today for rash and itching skin.  She actually saw 1 of my partners on September 9.  She has not had any new environmental exposures or medications though the dose on her Lexapro was increased about 2 weeks prior to the rash breaking out.  She was told that it was likely hives and was given some oral prednisone as well as hydroxyzine and a Solu-Medrol injection.  She says the rash is not as red as it was but the skin still feels itchy.  Lexapro was discontinued. She has some some dry patches and scale in some of the areas that were red before.  Still no CP, SOB, etc.  She has been sing Cetaphil lotion.    Past Medical History:  Diagnosis Date  . ADHD (attention deficit hyperactivity disorder)   . Anxiety   . Asthma   . Depression   . Environmental allergies     History reviewed. No pertinent surgical history.  Family History  Problem Relation Age of Onset  . Asthma Mother   . Irritable bowel syndrome Mother   . Hypertension Maternal Grandmother   . Heart disease Maternal Grandmother   . Cancer Maternal Grandmother   . Irritable bowel syndrome Sister     Social History   Socioeconomic History  . Marital status: Single    Spouse name: Not on file  . Number of children: Not on file  . Years of education: Not on file  . Highest education level: Not on file  Occupational History  . Not on file  Social Needs  . Financial resource strain: Not on file  . Food insecurity    Worry: Not on file    Inability: Not on file  . Transportation needs    Medical: Not on file    Non-medical: Not on file  Tobacco Use  . Smoking status: Never Smoker  . Smokeless tobacco: Never Used  Substance and Sexual Activity  . Alcohol use: No  . Drug use: No  . Sexual activity: Yes    Birth  control/protection: Pill  Lifestyle  . Physical activity    Days per week: Not on file    Minutes per session: Not on file  . Stress: Not on file  Relationships  . Social Musicianconnections    Talks on phone: Not on file    Gets together: Not on file    Attends religious service: Not on file    Active member of club or organization: Not on file    Attends meetings of clubs or organizations: Not on file    Relationship status: Not on file  . Intimate partner violence    Fear of current or ex partner: Not on file    Emotionally abused: Not on file    Physically abused: Not on file    Forced sexual activity: Not on file  Other Topics Concern  . Not on file  Social History Narrative  . Not on file    Outpatient Medications Prior to Visit  Medication Sig Dispense Refill  . albuterol (PROVENTIL) (2.5 MG/3ML) 0.083% nebulizer solution Take 3 mLs (2.5 mg total) by nebulization every 4 (four) hours as needed for wheezing or shortness of breath (please include nebulizer machine, hoses, and mask if  needed.). 30 vial 0  . albuterol (VENTOLIN HFA) 108 (90 Base) MCG/ACT inhaler Inhale 2 puffs into the lungs every 6 (six) hours as needed for wheezing or shortness of breath. 1 Inhaler 2  . amphetamine-dextroamphetamine (ADDERALL XR) 30 MG 24 hr capsule Take 1 capsule (30 mg total) by mouth daily. 30 capsule 0  . beclomethasone (QVAR) 40 MCG/ACT inhaler Inhale 2 puffs into the lungs 2 (two) times daily. 1 Inhaler 1  . escitalopram (LEXAPRO) 20 MG tablet Take 1 tablet (20 mg total) by mouth daily. One half tab daily for a week then one tab by mouth daily (Patient not taking: Reported on 03/26/2019) 90 tablet 0  . hydrOXYzine (VISTARIL) 25 MG capsule Take 1 capsule (25 mg total) by mouth every 6 (six) hours as needed. 60 capsule 0  . predniSONE (DELTASONE) 20 MG tablet Take 3 tablets for 3 days, take 2 tablets for 3 days, take 1 tablet for 3 days, take 1/2 tablet for 4 days. 20 tablet 0   No  facility-administered medications prior to visit.     Allergies  Allergen Reactions  . Lexapro [Escitalopram Oxalate] Nausea And Vomiting  . Amoxicillin Rash  . Trintellix [Vortioxetine] Nausea And Vomiting    ROS     Objective:    Physical Exam  Constitutional: She is oriented to person, place, and time. She appears well-developed and well-nourished.  HENT:  Head: Normocephalic and atraumatic.  Eyes: Conjunctivae and EOM are normal.  Cardiovascular: Normal rate.  Pulmonary/Chest: Effort normal.  Neurological: She is alert and oriented to person, place, and time.  Skin: Skin is dry. No pallor.  She has some patches of find scale on her anterior abdomen, low back and posterior calves. No significant erythema. No major excoriations.   Psychiatric: She has a normal mood and affect. Her behavior is normal.  Vitals reviewed.   BP (!) 97/54   Pulse 94   Ht 5\' 2"  (1.575 m)   Wt 130 lb (59 kg)   SpO2 100%   BMI 23.78 kg/m  Wt Readings from Last 3 Encounters:  04/03/19 130 lb (59 kg)  03/26/19 137 lb 1.6 oz (62.2 kg)  03/13/19 129 lb (58.5 kg)    Health Maintenance Due  Topic Date Due  . INFLUENZA VACCINE  02/02/2019    There are no preventive care reminders to display for this patient.   Lab Results  Component Value Date   TSH 1.34 11/26/2015   Lab Results  Component Value Date   WBC 13.8 (H) 12/23/2017   HGB 14.7 12/23/2017   HCT 40.4 12/23/2017   MCV 88.2 12/23/2017   PLT 334 12/23/2017   Lab Results  Component Value Date   NA 141 12/23/2017   K 4.3 12/23/2017   CO2 23 12/23/2017   GLUCOSE 98 12/23/2017   BUN 13 12/23/2017   CREATININE 0.83 12/23/2017   BILITOT 1.1 12/23/2017   ALKPHOS 60 12/23/2017   AST 26 12/23/2017   ALT 17 12/23/2017   PROT 8.1 12/23/2017   ALBUMIN 5.0 12/23/2017   CALCIUM 9.6 12/23/2017   ANIONGAP 11 12/23/2017   No results found for: CHOL No results found for: HDL No results found for: LDLCALC No results found for:  TRIG No results found for: CHOLHDL No results found for: 12/25/2017     Assessment & Plan:   Problem List Items Addressed This Visit    None    Visit Diagnoses    Rash    -  Primary  Relevant Orders   CBC with Differential/Platelet   COMPLETE METABOLIC PANEL WITH GFR   TSH   Need for immunization against influenza       Relevant Orders   Flu Vaccine QUAD 36+ mos IM (Completed)     Rash/itching - suspect chronic urticaria. Will tx with topical steroid and higher dose oral antihistamine. Rec zyrtec at bedtime and claritin in the AM and PRN topical steroid. If nto improving over the next month will consider referral to allergy and immunology.    Meds ordered this encounter  Medications  . cetirizine (ZYRTEC) 10 MG tablet    Sig: Take 1 tablet (10 mg total) by mouth daily.    Dispense:  30 tablet    Refill:  2  . loratadine (CLARITIN) 10 MG tablet    Sig: Take 1 tablet (10 mg total) by mouth daily.    Dispense:  30 tablet    Refill:  2  . triamcinolone cream (KENALOG) 0.1 %    Sig: Apply 1 application topically 2 (two) times daily.    Dispense:  80 g    Refill:  1     Beatrice Lecher, MD

## 2019-04-04 LAB — COMPLETE METABOLIC PANEL WITH GFR
AG Ratio: 1.7 (calc) (ref 1.0–2.5)
ALT: 12 U/L (ref 6–29)
AST: 15 U/L (ref 10–30)
Albumin: 4.6 g/dL (ref 3.6–5.1)
Alkaline phosphatase (APISO): 48 U/L (ref 31–125)
BUN: 9 mg/dL (ref 7–25)
CO2: 24 mmol/L (ref 20–32)
Calcium: 9.9 mg/dL (ref 8.6–10.2)
Chloride: 104 mmol/L (ref 98–110)
Creat: 0.86 mg/dL (ref 0.50–1.10)
GFR, Est African American: 113 mL/min/{1.73_m2} (ref >=60)
GFR, Est Non African American: 97 mL/min/{1.73_m2} (ref >=60)
Globulin: 2.7 g/dL (calc) (ref 1.9–3.7)
Glucose, Bld: 94 mg/dL (ref 65–99)
Potassium: 4 mmol/L (ref 3.5–5.3)
Sodium: 136 mmol/L (ref 135–146)
Total Bilirubin: 0.8 mg/dL (ref 0.2–1.2)
Total Protein: 7.3 g/dL (ref 6.1–8.1)

## 2019-04-04 LAB — CBC WITH DIFFERENTIAL/PLATELET
Absolute Monocytes: 446 cells/uL (ref 200–950)
Basophils Absolute: 50 cells/uL (ref 0–200)
Basophils Relative: 0.8 %
Eosinophils Absolute: 887 cells/uL — ABNORMAL HIGH (ref 15–500)
Eosinophils Relative: 14.3 %
HCT: 40 % (ref 35.0–45.0)
Hemoglobin: 13.9 g/dL (ref 11.7–15.5)
Lymphs Abs: 3137 cells/uL (ref 850–3900)
MCH: 32.3 pg (ref 27.0–33.0)
MCHC: 34.8 g/dL (ref 32.0–36.0)
MCV: 92.8 fL (ref 80.0–100.0)
MPV: 11.7 fL (ref 7.5–12.5)
Monocytes Relative: 7.2 %
Neutro Abs: 1680 cells/uL (ref 1500–7800)
Neutrophils Relative %: 27.1 %
Platelets: 150 10*3/uL (ref 140–400)
RBC: 4.31 10*6/uL (ref 3.80–5.10)
RDW: 12.6 % (ref 11.0–15.0)
Total Lymphocyte: 50.6 %
WBC: 6.2 10*3/uL (ref 3.8–10.8)

## 2019-04-04 LAB — TSH: TSH: 2.37 mIU/L

## 2019-04-22 ENCOUNTER — Other Ambulatory Visit: Payer: Self-pay | Admitting: Family Medicine

## 2019-04-22 MED FILL — LORazepam 2 MG TABS: 2 | 1 days supply | Qty: 1 | Fill #0

## 2019-04-22 MED FILL — ALBUTEROL SULFATE HFA 108 (: 108 (90 BAS | 25 days supply | Qty: 18 | Fill #0

## 2019-04-22 MED FILL — HYDROCODON-APAP 5-325: 5-325 | 2 days supply | Qty: 8 | Fill #0

## 2019-04-22 MED FILL — ONDANSETRON ODT 4 MG TABLET: 4 | 2 days supply | Qty: 6 | Fill #0

## 2019-04-22 MED FILL — CLINDAMYCIN HCL 150 MG CAPS: 150 | 5 days supply | Qty: 15 | Fill #0

## 2019-05-09 MED FILL — CLINDAMYCIN HCL 150 MG CAPS: 150 | 5 days supply | Qty: 15 | Fill #0

## 2019-05-09 MED FILL — ALBUTEROL SULFATE HFA 108 (: 108 (90 BAS | 25 days supply | Qty: 18 | Fill #0

## 2019-05-09 MED FILL — ONDANSETRON ODT 4 MG TABLET: 4 | 2 days supply | Qty: 6 | Fill #0

## 2019-05-09 MED FILL — HYDROCODON-APAP 5-325: 5-325 | 2 days supply | Qty: 8 | Fill #0

## 2019-05-09 MED FILL — LORazepam 2 MG TABS: 2 | 1 days supply | Qty: 1 | Fill #0

## 2019-05-20 MED FILL — CLINDAMYCIN HCL 150 MG CAPS: 150 | 10 days supply | Qty: 30 | Fill #0

## 2019-06-10 MED FILL — ALBUTEROL SULFATE HFA 108 (: 108 (90 BAS | 25 days supply | Qty: 18 | Fill #1

## 2019-06-11 ENCOUNTER — Ambulatory Visit (INDEPENDENT_AMBULATORY_CARE_PROVIDER_SITE_OTHER): Payer: 59 | Admitting: Family Medicine

## 2019-06-11 ENCOUNTER — Other Ambulatory Visit: Payer: Self-pay

## 2019-06-11 VITALS — BP 116/78 | Ht 62.0 in | Wt 130.0 lb

## 2019-06-11 DIAGNOSIS — Z3042 Encounter for surveillance of injectable contraceptive: Secondary | ICD-10-CM

## 2019-06-11 MED ORDER — MEDROXYPROGESTERONE ACETATE 150 MG/ML IM SUSP
150.0000 mg | Freq: Once | INTRAMUSCULAR | Status: AC
  Administered 2019-06-11: 150 mg via INTRAMUSCULAR

## 2019-06-11 NOTE — Progress Notes (Signed)
Agree with documentation as above.   Catherine Metheney, MD  

## 2019-06-11 NOTE — Progress Notes (Signed)
Pt is here for Depo Provera injection. Denies chest pain, SOB, headaches, mood changes or problems well. Injection administered in the LUOQ per patient request. Patient tolerated injection well without  Immediate complications. Patient was advised to schedule next injection in 12 weeks. I handed the calender with the next dates to get her injection and she took to the front for scheduling. No other questions at this time.

## 2019-07-17 ENCOUNTER — Encounter: Payer: Self-pay | Admitting: Family Medicine

## 2019-07-17 DIAGNOSIS — J4541 Moderate persistent asthma with (acute) exacerbation: Secondary | ICD-10-CM

## 2019-07-18 MED ORDER — MONTELUKAST SODIUM 10 MG PO TABS: 10.0000 mg | ORAL_TABLET | Freq: Every day | ORAL | 3 refills | Status: DC

## 2019-07-18 MED ORDER — ALBUTEROL SULFATE HFA 108 (90 BASE) MCG/ACT IN AERS: INHALATION_SPRAY | RESPIRATORY_TRACT | 2 refills | Status: DC

## 2019-07-18 MED FILL — ALBUTEROL SULFATE HFA 108 (: 108 (90 BAS | 25 days supply | Qty: 18 | Fill #0

## 2019-07-18 MED FILL — MONTELUKAST SOD 10 MG TAB: 10 | 90 days supply | Qty: 90 | Fill #0

## 2019-07-18 NOTE — Telephone Encounter (Signed)
Meds sent

## 2019-08-27 ENCOUNTER — Other Ambulatory Visit: Payer: Self-pay

## 2019-08-27 ENCOUNTER — Ambulatory Visit (INDEPENDENT_AMBULATORY_CARE_PROVIDER_SITE_OTHER): Payer: 59 | Admitting: Family Medicine

## 2019-08-27 VITALS — Temp 97.9°F | Ht 62.0 in | Wt 134.0 lb

## 2019-08-27 DIAGNOSIS — Z3042 Encounter for surveillance of injectable contraceptive: Secondary | ICD-10-CM | POA: Diagnosis not present

## 2019-08-27 MED ORDER — MEDROXYPROGESTERONE ACETATE 150 MG/ML IM SUSP
150.0000 mg | Freq: Once | INTRAMUSCULAR | Status: AC
  Administered 2019-08-27: 14:00:00 150 mg via INTRAMUSCULAR

## 2019-08-27 NOTE — Progress Notes (Signed)
Patient presents to clinic for her Depo Provera shot. She is within the window and denies any side effects such as chest pain, shortness of breath or mood swings while taking the medication. She tolerated the injection well in her RUOQ with no immediate complications. Patient was advised to schedule to in the next 12-14 weeks. She was given a copy of the next days to schedule for the next Depo Provera injection. No other questions.

## 2019-08-27 NOTE — Progress Notes (Signed)
Agree with documentation as above.   Kealan Buchan, MD  

## 2019-08-27 NOTE — Patient Instructions (Signed)
Patient will follow up in 3 weeks for his next B12 injection.

## 2019-09-06 ENCOUNTER — Encounter: Payer: Self-pay | Admitting: Family Medicine

## 2019-09-12 ENCOUNTER — Encounter: Payer: Self-pay | Admitting: Family Medicine

## 2019-09-12 ENCOUNTER — Telehealth (INDEPENDENT_AMBULATORY_CARE_PROVIDER_SITE_OTHER): Payer: 59 | Admitting: Family Medicine

## 2019-09-12 DIAGNOSIS — U071 COVID-19: Secondary | ICD-10-CM | POA: Diagnosis not present

## 2019-09-12 DIAGNOSIS — Z20828 Contact with and (suspected) exposure to other viral communicable diseases: Secondary | ICD-10-CM | POA: Diagnosis not present

## 2019-09-12 DIAGNOSIS — Z03818 Encounter for observation for suspected exposure to other biological agents ruled out: Secondary | ICD-10-CM | POA: Diagnosis not present

## 2019-09-12 MED ORDER — PREDNISONE 20 MG PO TABS: 20.0000 mg | ORAL_TABLET | Freq: Two times a day (BID) | ORAL | 0 refills | Status: AC

## 2019-09-12 MED FILL — predniSONE 20 MG TABS: 20 | 5 days supply | Qty: 10 | Fill #0

## 2019-09-12 NOTE — Progress Notes (Signed)
Patient was tested for covid today. Patient is not having any shortness of breath or trouble breathing at this time but she wanted to get checked out.  She lost taste and smell but not other symptoms. Denies headache, fever, sinus pressure.   She wants to talk about possible interactions with her Asthma.

## 2019-09-12 NOTE — Assessment & Plan Note (Addendum)
-  Patient with + rapid covid test today -Relatively asymptomatic other than loss of taste/smell.  -She does have history of asthma.  Well controlled with current medications  Will call in course of steroids, she is instructed to start this if having increased wheezing or developing cough/sob.  I also asked that she let us know if she needed to start.  -She will continue current maintenance medications of Qvar and singulair for now with albuterol at needed.  -She is aware of isolation recommendations.  -She is aware to contact our clinic with any question or if symptoms worsen.

## 2019-09-12 NOTE — Progress Notes (Signed)
Shannon Bishop - 19 y.o. female MRN [id number]  Date of birth: [birth year]   This visit type was conducted due to national recommendations for restrictions regarding the COVID-19 Pandemic (e.g. social distancing).  This format is felt to be most appropriate for this patient at this time.  All issues noted in this document were discussed and addressed.  No physical exam was performed (except for noted visual exam findings with Video Visits).  I discussed the limitations of evaluation and management by telemedicine and the availability of in person appointments. The patient expressed understanding and agreed to proceed.  I connected with@ on 09/12/19 at  3:00 PM EST by a video enabled telemedicine application and verified that I am speaking with the correct person using two identifiers.  Present at visit: Luetta Nutting, DO Ria Clock   Patient Location: [place] [address]   Provider location:   Home office  Chief Complaint  Patient presents with  . Covid Exposure    HPI  Shannon Bishop is a 19 y.o. female who presents via audio/video conferencing for a telehealth visit today.  She reports having rapid test for COVID-19 today that returned positive.  She states that only symptoms are loss of taste and smell.  She does have history of asthma but denies any increased respiratory problems including cough, shortness of breath, or wheezing.  She is compliant with maintenance medications.  She has plenty of albuterol.     ROS:  A comprehensive ROS was completed and negative except as noted per HPI  Past Medical History:  Diagnosis Date  . ADHD (attention deficit hyperactivity disorder)   . Anxiety   . Asthma   . Depression   . Environmental allergies     No past surgical history on file.  Family History  Problem Relation Age of Onset  . Asthma Mother   . Irritable bowel syndrome Mother   . Hypertension Maternal Grandmother   . Heart disease Maternal  Grandmother   . Cancer Maternal Grandmother   . Irritable bowel syndrome Sister     Social History   Socioeconomic History  . Marital status: Single    Spouse name: Not on file  . Number of children: Not on file  . Years of education: Not on file  . Highest education level: Not on file  Occupational History  . Not on file  Tobacco Use  . Smoking status: Never Smoker  . Smokeless tobacco: Never Used  Substance and Sexual Activity  . Alcohol use: No  . Drug use: No  . Sexual activity: Yes    Birth control/protection: Pill  Other Topics Concern  . Not on file  Social History Narrative  . Not on file   Social Determinants of Health   Financial Resource Strain:   . Difficulty of Paying Living Expenses:   Food Insecurity:   . Worried About Charity fundraiser in the Last Year:   . Arboriculturist in the Last Year:   Transportation Needs:   . Film/video editor (Medical):   [name] Lack of Transportation (Non-Medical):   Physical Activity:   . Days of Exercise per Week:   . Minutes of Exercise per Session:   Stress:   . Feeling of Stress :   Social Connections:   . Frequency of Communication with Friends and Family:   . Frequency of Social Gatherings with Friends and Family:   . Attends Religious Services:   .  Active Member of Clubs or Organizations:   . Attends Banker Meetings:   [name] Marital Status:   Intimate Partner Violence:   . Fear of Current or Ex-Partner:   . Emotionally Abused:   [name] Physically Abused:   . Sexually Abused:      Current Outpatient Medications:  .  albuterol (PROVENTIL) (2.5 MG/3ML) 0.083% nebulizer solution, Take 3 mLs (2.5 mg total) by nebulization every 4 (four) hours as needed for wheezing or shortness of breath (please include nebulizer machine, hoses, and mask if needed.)., Disp: 30 vial, Rfl: 0 .  albuterol (VENTOLIN HFA) 108 (90 Base) MCG/ACT inhaler, INHALE 2 PUFFS BY MOUTH INTO THE LUNGS EVERY 6 (SIX) HOURS AS NEEDED FOR  WHEEZING OR SHORTNESS OF BREATH., Disp: 18 g, Rfl: 2 .  beclomethasone (QVAR) 40 MCG/ACT inhaler, Inhale 2 puffs into the lungs 2 (two) times daily., Disp: 1 Inhaler, Rfl: 1 .  montelukast (SINGULAIR) 10 MG tablet, Take 1 tablet (10 mg total) by mouth at bedtime., Disp: 90 tablet, Rfl: 3 .  triamcinolone cream (KENALOG) 0.1 %, Apply 1 application topically 2 (two) times daily., Disp: 80 g, Rfl: 1 .  amphetamine-dextroamphetamine (ADDERALL XR) 30 MG 24 hr capsule, Take 1 capsule (30 mg total) by mouth daily. (Patient not taking: Reported on 09/12/2019), Disp: 30 capsule, Rfl: 0  EXAM:  VITALS per patient if applicable: Ht 5\' 2"  (1.575 m)   Wt 134 lb (60.8 kg)   LMP 08/20/2019   BMI 24.51 kg/m   GENERAL: alert, oriented, appears well and in no acute distress  HEENT: atraumatic, conjunttiva clear, no obvious abnormalities on inspection of external nose and ears  NECK: normal movements of the head and neck  LUNGS: on inspection no signs of respiratory distress, breathing rate appears normal, no obvious gross SOB, gasping or wheezing  CV: no obvious cyanosis  MS: moves all visible extremities without noticeable abnormality  PSYCH/NEURO: pleasant and cooperative, no obvious depression or anxiety, speech and thought processing grossly intact  ASSESSMENT AND PLAN:  Discussed the following assessment and plan:  COVID-19 -Patient with + rapid covid test today -Relatively asymptomatic other than loss of taste/smell.  -She does have history of asthma.  Well controlled with current medications  Will call in course of steroids, she is instructed to start this if having increased wheezing or developing cough/sob.  I also asked that she let 08/22/2019 know if she needed to start.  -She will continue current maintenance medications of Qvar and singulair for now with albuterol at needed.  -She is aware of isolation recommendations.  -She is aware to contact our clinic with any question or if symptoms  worsen.     30 minutes spent including pre visit preparation, review of prior notes and labs, encounter with patient via video visit and same day documentation.  I discussed the assessment and treatment plan with the patient. The patient was provided an opportunity to ask questions and all were answered. The patient agreed with the plan and demonstrated an understanding of the instructions.   The patient was advised to call back or seek an in-person evaluation if the symptoms worsen or if the condition fails to improve as anticipated.    [place], DO

## 2019-09-13 ENCOUNTER — Encounter (INDEPENDENT_AMBULATORY_CARE_PROVIDER_SITE_OTHER): Payer: Self-pay

## 2019-09-14 ENCOUNTER — Encounter (INDEPENDENT_AMBULATORY_CARE_PROVIDER_SITE_OTHER): Payer: Self-pay

## 2019-09-27 MED FILL — ALBUTEROL SULFATE HFA 108 (: 108 (90 BAS | 25 days supply | Qty: 18 | Fill #1

## 2019-11-04 ENCOUNTER — Encounter: Payer: Self-pay | Admitting: Family Medicine

## 2019-11-04 MED ORDER — ONDANSETRON 4 MG PO TBDP: 4.0000 mg | ORAL_TABLET | Freq: Three times a day (TID) | ORAL | 1 refills | Status: DC | PRN

## 2019-11-04 MED FILL — ONDANSETRON ODT 4MG TBDP: 4 | 4 days supply | Qty: 12 | Fill #0

## 2019-11-04 NOTE — Telephone Encounter (Signed)
Zofran sent to pharmacy for the nausea and vomiting.  But we need her to make an appointment to discuss treatment options for her migraines if they are not well controlled with ibuprofen.

## 2019-11-12 ENCOUNTER — Encounter: Payer: Self-pay | Admitting: Family Medicine

## 2019-11-12 ENCOUNTER — Ambulatory Visit (INDEPENDENT_AMBULATORY_CARE_PROVIDER_SITE_OTHER): Payer: 59 | Admitting: Family Medicine

## 2019-11-12 ENCOUNTER — Other Ambulatory Visit: Payer: Self-pay

## 2019-11-12 VITALS — BP 107/60 | HR 85

## 2019-11-12 DIAGNOSIS — F411 Generalized anxiety disorder: Secondary | ICD-10-CM | POA: Diagnosis not present

## 2019-11-12 DIAGNOSIS — J4540 Moderate persistent asthma, uncomplicated: Secondary | ICD-10-CM | POA: Diagnosis not present

## 2019-11-12 DIAGNOSIS — Z3042 Encounter for surveillance of injectable contraceptive: Secondary | ICD-10-CM

## 2019-11-12 DIAGNOSIS — G43109 Migraine with aura, not intractable, without status migrainosus: Secondary | ICD-10-CM

## 2019-11-12 MED ORDER — SERTRALINE HCL 25 MG PO TABS: 25.0000 mg | ORAL_TABLET | Freq: Every day | ORAL | 0 refills | Status: DC

## 2019-11-12 MED ORDER — QVAR 40 MCG/ACT IN AERS: 2.0000 | INHALATION_SPRAY | Freq: Two times a day (BID) | RESPIRATORY_TRACT | 2 refills | Status: DC

## 2019-11-12 MED ORDER — ZOLMITRIPTAN 2.5 MG PO TBDP: 2.5000 mg | ORAL_TABLET | ORAL | 3 refills | Status: DC | PRN

## 2019-11-12 MED ORDER — MEDROXYPROGESTERONE ACETATE 150 MG/ML IM SUSP
150.0000 mg | Freq: Once | INTRAMUSCULAR | Status: AC
  Administered 2019-11-12: 150 mg via INTRAMUSCULAR

## 2019-11-12 NOTE — Progress Notes (Signed)
Established Patient Office Visit  Subjective:  Patient ID: Shannon Bishop, female    DOB: February 14, 2001  Age: 19 y.o. MRN: [id number]  CC:  Chief Complaint  Patient presents with  . Contraception    HPI Shannon Bishop is here for a Depo Provera injection. Denies chest pain, shortness of breath, headaches, mood changes or problems with medication. It has been < 14 weeks since her last Depo Provera injection.   Past Medical History:  Diagnosis Date  . ADHD (attention deficit hyperactivity disorder)   . Anxiety   . Asthma   . Depression   . Environmental allergies     History reviewed. No pertinent surgical history.  Family History  Problem Relation Age of Onset  . Asthma Mother   . Irritable bowel syndrome Mother   . Hypertension Maternal Grandmother   . Heart disease Maternal Grandmother   . Cancer Maternal Grandmother   . Irritable bowel syndrome Sister     Social History   Socioeconomic History  . Marital status: Single    Spouse name: Not on file  . Number of children: Not on file  . Years of education: Not on file  . Highest education level: Not on file  Occupational History  . Not on file  Tobacco Use  . Smoking status: Never Smoker  . Smokeless tobacco: Never Used  Substance and Sexual Activity  . Alcohol use: No  . Drug use: No  . Sexual activity: Yes    Birth control/protection: Pill  Other Topics Concern  . Not on file  Social History Narrative  . Not on file   Social Determinants of Health   Financial Resource Strain:   . Difficulty of Paying Living Expenses:   Food Insecurity:   . Worried About Programme researcher, broadcasting/film/video in the Last Year:   . Barista in the Last Year:   Transportation Needs:   . Freight forwarder (Medical):   [name] Lack of Transportation (Non-Medical):   Physical Activity:   . Days of Exercise per Week:   . Minutes of Exercise per Session:   Stress:   . Feeling of Stress :   Social Connections:   . Frequency of  Communication with Friends and Family:   . Frequency of Social Gatherings with Friends and Family:   . Attends Religious Services:   . Active Member of Clubs or Organizations:   . Attends Banker Meetings:   [name] Marital Status:   Intimate Partner Violence:   . Fear of Current or Ex-Partner:   . Emotionally Abused:   [name] Physically Abused:   . Sexually Abused:     Outpatient Medications Prior to Visit  Medication Sig Dispense Refill  . albuterol (PROVENTIL) (2.5 MG/3ML) 0.083% nebulizer solution Take 3 mLs (2.5 mg total) by nebulization every 4 (four) hours as needed for wheezing or shortness of breath (please include nebulizer machine, hoses, and mask if needed.). 30 vial 0  . albuterol (VENTOLIN HFA) 108 (90 Base) MCG/ACT inhaler INHALE 2 PUFFS BY MOUTH INTO THE LUNGS EVERY 6 (SIX) HOURS AS NEEDED FOR WHEEZING OR SHORTNESS OF BREATH. 18 g 2  . beclomethasone (QVAR) 40 MCG/ACT inhaler Inhale 2 puffs into the lungs 2 (two) times daily. 1 Inhaler 1  . montelukast (SINGULAIR) 10 MG tablet Take 1 tablet (10 mg total) by mouth at bedtime. 90 tablet 3  . ondansetron (ZOFRAN ODT) 4 MG disintegrating tablet Take 1 tablet (4 mg total) by mouth  every 8 (eight) hours as needed for nausea or vomiting. 12 tablet 1  . triamcinolone cream (KENALOG) 0.1 % Apply 1 application topically 2 (two) times daily. 80 g 1  . amphetamine-dextroamphetamine (ADDERALL XR) 30 MG 24 hr capsule Take 1 capsule (30 mg total) by mouth daily. (Patient not taking: Reported on 11/12/2019) 30 capsule 0   No facility-administered medications prior to visit.    Allergies  Allergen Reactions  . Lexapro [Escitalopram Oxalate] Nausea And Vomiting  . Amoxicillin Rash  . Trintellix [Vortioxetine] Nausea And Vomiting    ROS Review of Systems    Objective:    Physical Exam  BP 107/60   Pulse 85   SpO2 100%  Wt Readings from Last 3 Encounters:  09/12/19 134 lb (60.8 kg)  08/27/19 134 lb (60.8 kg)  06/11/19 130  lb (59 kg)     Health Maintenance Due  Topic Date Due  . PAP-Cervical Cytology Screening  Never done  . PAP SMEAR-Modifier  Never done    There are no preventive care reminders to display for this patient.  Lab Results  Component Value Date   TSH 2.37 04/03/2019   Lab Results  Component Value Date   WBC 6.2 04/03/2019   HGB 13.9 04/03/2019   HCT 40.0 04/03/2019   MCV 92.8 04/03/2019   PLT 150 04/03/2019   Lab Results  Component Value Date   NA 136 04/03/2019   K 4.0 04/03/2019   CO2 24 04/03/2019   GLUCOSE 94 04/03/2019   BUN 9 04/03/2019   CREATININE 0.86 04/03/2019   BILITOT 0.8 04/03/2019   ALKPHOS 60 12/23/2017   AST 15 04/03/2019   ALT 12 04/03/2019   PROT 7.3 04/03/2019   ALBUMIN 5.0 12/23/2017   CALCIUM 9.9 04/03/2019   ANIONGAP 11 12/23/2017   No results found for: CHOL No results found for: HDL No results found for: LDLCALC No results found for: TRIG No results found for: CHOLHDL No results found for: HGBA1C    Assessment & Plan:  Contraception - Patient tolerated injection well without complications. Patient advised to schedule next injection between 7/27-8/10.  Problem List Items Addressed This Visit    None    Visit Diagnoses    Encounter for Depo-Provera contraception    -  Primary   Relevant Medications   medroxyPROGESTERone (DEPO-PROVERA) injection 150 mg (Completed) (Start on 11/12/2019 10:30 AM)      Meds ordered this encounter  Medications  . medroxyPROGESTERone (DEPO-PROVERA) injection 150 mg    Follow-up: Return in about 12 weeks (around 02/04/2020) for Depo-Provera injection. Between 7/27-8/10.    Lavell Luster, [place]

## 2019-11-12 NOTE — Assessment & Plan Note (Signed)
GAD-7 score of 16 and PHQ-9 score of 16 as well.  Discussed treatment strategies.  Previously failed and tried Lexapro we will try sertraline new prescription started on see her back in about 3 weeks.  Discussed expectations with the medication including side effects.  Would also like to refer her for therapy/counseling I think that this could really be helpful for her in the long-term.

## 2019-11-12 NOTE — Assessment & Plan Note (Signed)
She also needs a refill on her albuterol.  She reports that she recently just restarted her Qvar for the spring.  This is usually a troublesome time a year for her.

## 2019-11-12 NOTE — Progress Notes (Signed)
Acute Office Visit  Subjective:    Patient ID: Shannon Bishop, female    DOB: 06-Apr-2001, 19 y.o.   MRN: [id number]  Chief Complaint  Patient presents with  . Headache  . mood    HPI Patient is in today for anxiety.  She reports that she is just been really struggling with her anxiety.  She does not have any specific stressors that she can pinpoint she just feels like it is just a general feeling.  At one point several years ago she was actually on Lexapro and felt like it 10 mg it just was not effective and then it 20 mg had side effects.  She did work work with a Environmental consultant couple years ago but felt like it really was not all that helpful.  She also complains of migraine headaches.  She has never been formally diagnosed but complains of more severe headaches that occur about once a month that are associated with light and sound sensitivity as well as nausea.  She says when they occur they usually last for about 48 hours.  She usually tries to take ibuprofen but it does not really help all that much.  Her mother and grandmother both have a history of migraines.  She says sometimes applying heat helps.  She does describe a "glazed" that comes over her vision before the headaches start.  She says she also gets a more tension type headache about once a week which is much more mild.  Past Medical History:  Diagnosis Date  . ADHD (attention deficit hyperactivity disorder)   . Anxiety   . Asthma   . Depression   . Environmental allergies     No past surgical history on file.  Family History  Problem Relation Age of Onset  . Asthma Mother   . Irritable bowel syndrome Mother   . Hypertension Maternal Grandmother   . Heart disease Maternal Grandmother   . Cancer Maternal Grandmother   . Irritable bowel syndrome Sister     Social History   Socioeconomic History  . Marital status: Single    Spouse name: Not on file  . Number of children: Not on file  . Years of  education: Not on file  . Highest education level: Not on file  Occupational History  . Not on file  Tobacco Use  . Smoking status: Never Smoker  . Smokeless tobacco: Never Used  Substance and Sexual Activity  . Alcohol use: No  . Drug use: No  . Sexual activity: Yes    Birth control/protection: Pill  Other Topics Concern  . Not on file  Social History Narrative  . Not on file   Social Determinants of Health   Financial Resource Strain:   . Difficulty of Paying Living Expenses:   Food Insecurity:   . Worried About Programme researcher, broadcasting/film/video in the Last Year:   . Barista in the Last Year:   Transportation Needs:   . Freight forwarder (Medical):   [name] Lack of Transportation (Non-Medical):   Physical Activity:   . Days of Exercise per Week:   . Minutes of Exercise per Session:   Stress:   . Feeling of Stress :   Social Connections:   . Frequency of Communication with Friends and Family:   . Frequency of Social Gatherings with Friends and Family:   . Attends Religious Services:   . Active Member of Clubs or Organizations:   . Attends Club  or Organization Meetings:   [name] Marital Status:   Intimate Partner Violence:   . Fear of Current or Ex-Partner:   . Emotionally Abused:   [name] Physically Abused:   . Sexually Abused:     Outpatient Medications Prior to Visit  Medication Sig Dispense Refill  . albuterol (PROVENTIL) (2.5 MG/3ML) 0.083% nebulizer solution Take 3 mLs (2.5 mg total) by nebulization every 4 (four) hours as needed for wheezing or shortness of breath (please include nebulizer machine, hoses, and mask if needed.). 30 vial 0  . albuterol (VENTOLIN HFA) 108 (90 Base) MCG/ACT inhaler INHALE 2 PUFFS BY MOUTH INTO THE LUNGS EVERY 6 (SIX) HOURS AS NEEDED FOR WHEEZING OR SHORTNESS OF BREATH. 18 g 2  . amphetamine-dextroamphetamine (ADDERALL XR) 30 MG 24 hr capsule Take 1 capsule (30 mg total) by mouth daily. (Patient not taking: Reported on 11/12/2019) 30 capsule 0  .  montelukast (SINGULAIR) 10 MG tablet Take 1 tablet (10 mg total) by mouth at bedtime. 90 tablet 3  . ondansetron (ZOFRAN ODT) 4 MG disintegrating tablet Take 1 tablet (4 mg total) by mouth every 8 (eight) hours as needed for nausea or vomiting. 12 tablet 1  . beclomethasone (QVAR) 40 MCG/ACT inhaler Inhale 2 puffs into the lungs 2 (two) times daily. 1 Inhaler 1  . triamcinolone cream (KENALOG) 0.1 % Apply 1 application topically 2 (two) times daily. 80 g 1   No facility-administered medications prior to visit.    Allergies  Allergen Reactions  . Lexapro [Escitalopram Oxalate] Nausea And Vomiting  . Amoxicillin Rash  . Trintellix [Vortioxetine] Nausea And Vomiting    Review of Systems     Objective:    Physical Exam Vitals reviewed.  Constitutional:      Appearance: She is well-developed.  HENT:     Head: Normocephalic and atraumatic.  Eyes:     Conjunctiva/sclera: Conjunctivae normal.  Cardiovascular:     Rate and Rhythm: Normal rate.  Pulmonary:     Effort: Pulmonary effort is normal.  Skin:    General: Skin is dry.     Coloration: Skin is not pale.  Neurological:     Mental Status: She is alert and oriented to person, place, and time.  Psychiatric:        Behavior: Behavior normal.     BP 107/60   Pulse 85   SpO2 100%  Wt Readings from Last 3 Encounters:  09/12/19 134 lb (60.8 kg)  08/27/19 134 lb (60.8 kg)  06/11/19 130 lb (59 kg)    Health Maintenance Due  Topic Date Due  . PAP-Cervical Cytology Screening  Never done  . PAP SMEAR-Modifier  Never done    There are no preventive care reminders to display for this patient.   Lab Results  Component Value Date   TSH 2.37 04/03/2019   Lab Results  Component Value Date   WBC 6.2 04/03/2019   HGB 13.9 04/03/2019   HCT 40.0 04/03/2019   MCV 92.8 04/03/2019   PLT 150 04/03/2019   Lab Results  Component Value Date   NA 136 04/03/2019   K 4.0 04/03/2019   CO2 24 04/03/2019   GLUCOSE 94 04/03/2019    BUN 9 04/03/2019   CREATININE 0.86 04/03/2019   BILITOT 0.8 04/03/2019   ALKPHOS 60 12/23/2017   AST 15 04/03/2019   ALT 12 04/03/2019   PROT 7.3 04/03/2019   ALBUMIN 5.0 12/23/2017   CALCIUM 9.9 04/03/2019   ANIONGAP 11 12/23/2017   No results  found for: CHOL No results found for: HDL No results found for: LDLCALC No results found for: TRIG No results found for: CHOLHDL No results found for: HGBA1C     Assessment & Plan:   Problem List Items Addressed This Visit      Cardiovascular and Mediastinum   Migraine with aura and without status migrainosus, not intractable    New diagnosis.  Discussed treatment options we will start with acute treatment with a triptan.  Would like to see her back in a couple months to see how she is doing and at that point we could consider prophylaxis depending on the frequency of her migraines which is about once a month currently.  Can discuss potential side effects of triptan's.      Relevant Medications   sertraline (ZOLOFT) 25 MG tablet   zolmitriptan (ZOMIG ZMT) 2.5 MG disintegrating tablet     Respiratory   Asthma in adult, moderate persistent, uncomplicated    She also needs a refill on her albuterol.  She reports that she recently just restarted her Qvar for the spring.  This is usually a troublesome time a year for her.      Relevant Medications   beclomethasone (QVAR) 40 MCG/ACT inhaler     Other   Generalized anxiety disorder - Primary    GAD-7 score of 16 and PHQ-9 score of 16 as well.  Discussed treatment strategies.  Previously failed and tried Lexapro we will try sertraline new prescription started on see her back in about 3 weeks.  Discussed expectations with the medication including side effects.  Would also like to refer her for therapy/counseling I think that this could really be helpful for her in the long-term.      Relevant Medications   sertraline (ZOLOFT) 25 MG tablet       Meds ordered this encounter   Medications  . sertraline (ZOLOFT) 25 MG tablet    Sig: Take 1 tablet (25 mg total) by mouth daily. OK to increase to 2 tas after 10 days.    Dispense:  60 tablet    Refill:  0  . beclomethasone (QVAR) 40 MCG/ACT inhaler    Sig: Inhale 2 puffs into the lungs 2 (two) times daily.    Dispense:  1 Inhaler    Refill:  2  . zolmitriptan (ZOMIG ZMT) 2.5 MG disintegrating tablet    Sig: Take 1 tablet (2.5 mg total) by mouth as needed for migraine. Ok to repeat dose in 2 hours. No more than 2 doses in 24 hours    Dispense:  10 tablet    Refill:  3     Beatrice Lecher, MD

## 2019-11-12 NOTE — Assessment & Plan Note (Signed)
New diagnosis.  Discussed treatment options we will start with acute treatment with a triptan.  Would like to see her back in a couple months to see how she is doing and at that point we could consider prophylaxis depending on the frequency of her migraines which is about once a month currently.  Can discuss potential side effects of triptan's.

## 2019-11-13 NOTE — Progress Notes (Signed)
Agree with documentation as above.   Konni Kesinger, MD  

## 2019-11-18 ENCOUNTER — Telehealth: Payer: Self-pay | Admitting: Family Medicine

## 2019-11-18 NOTE — Telephone Encounter (Signed)
Received fax for PA on Zolmitriptan sent through cover my meds waiting on determination. - CF °

## 2019-11-21 MED ORDER — RIZATRIPTAN BENZOATE 10 MG PO TABS: 10.0000 mg | ORAL_TABLET | ORAL | 3 refills | Status: DC | PRN

## 2019-11-21 MED FILL — RIZATRIPTAN BENZOATE 10 MG: 10 | 17 days supply | Qty: 10 | Fill #0

## 2019-11-21 NOTE — Telephone Encounter (Signed)
Left message for a return call

## 2019-11-21 NOTE — Telephone Encounter (Signed)
Please call patient and let her know that the Zomig was not covered on her insurance but it looks like the Maxalt and Imitrex are.  So send over new prescription for her to try Maxalt for her migraine headaches.  Just FYI.

## 2019-11-25 NOTE — Telephone Encounter (Signed)
LVM advising pt of recommendations and advised of change of medication.

## 2019-11-26 MED FILL — ZOLMitriptan 2.5 MG TBDP: 2.5 | 30 days supply | Qty: 8 | Fill #0

## 2019-12-03 ENCOUNTER — Other Ambulatory Visit: Payer: Self-pay

## 2019-12-03 ENCOUNTER — Encounter: Payer: Self-pay | Admitting: Family Medicine

## 2019-12-03 ENCOUNTER — Ambulatory Visit (INDEPENDENT_AMBULATORY_CARE_PROVIDER_SITE_OTHER): Payer: 59 | Admitting: Family Medicine

## 2019-12-03 VITALS — BP 94/47 | HR 79 | Ht 62.0 in | Wt 134.0 lb

## 2019-12-03 DIAGNOSIS — F411 Generalized anxiety disorder: Secondary | ICD-10-CM | POA: Diagnosis not present

## 2019-12-03 DIAGNOSIS — G43109 Migraine with aura, not intractable, without status migrainosus: Secondary | ICD-10-CM | POA: Diagnosis not present

## 2019-12-03 MED ORDER — SERTRALINE HCL 50 MG PO TABS: 75.0000 mg | ORAL_TABLET | Freq: Every day | ORAL | 0 refills | Status: DC

## 2019-12-03 MED FILL — SERTRALINE HCL 50 MG TABS: 50 | 68 days supply | Qty: 135 | Fill #0

## 2019-12-03 NOTE — Assessment & Plan Note (Signed)
Hasn't had a change to try the new triptan. The maxalt was covered.

## 2019-12-03 NOTE — Assessment & Plan Note (Addendum)
Tried and failed Lexapro.  3 weeks on new start sertraline.  Previous PHQ-9 score was 16 and is down to 4 today.  Previous GAD-7 score was 13 and it is down to 5 today.  I think she is had great success so far on the regimen she is currently just taking 50 mg daily.  She would like to try 75mg  if possible.  Will adjust prescription and send new went over.  Plan to follow-up in 6 weeks.

## 2019-12-03 NOTE — Progress Notes (Signed)
Established Patient Office Visit  Subjective:  Patient ID: Shannon Bishop, female    DOB: Aug 14, 2000  Age: 19 y.o. MRN: [id number]  CC:  Chief Complaint  Patient presents with  . mood  . Migraine    HPI Shannon Bishop presents for   F/U GAD - new start sertraline.  Follow-up 3 weeks. She says she is really doing well on the new medication. No side effects.  Though she said she did have 1 day where her pulse shot up about 140 about 30 minutes after she took her medication.  She says it only happens once and it has not occurred since then.  No chest pain.  She caught it on her apple watch.  She feels like it is made a big difference in improving her depression and helping with some of her anxiety symptoms.  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  So this can be stressful at times.  Follow-up migraine headaches-we had some initial problems getting a triptan covered for her.  She has not had a migraine yet to try the medication out.  Past Medical History:  Diagnosis Date  . ADHD (attention deficit hyperactivity disorder)   . Anxiety   . Asthma   . Depression   . Environmental allergies     No past surgical history on file.  Family History  Problem Relation Age of Onset  . Asthma Mother   . Irritable bowel syndrome Mother   . Hypertension Maternal Grandmother   . Heart disease Maternal Grandmother   . Cancer Maternal Grandmother   . Irritable bowel syndrome Sister     Social History   Socioeconomic History  . Marital status: Single    Spouse name: Not on file  . Number of children: Not on file  . Years of education: Not on file  . Highest education level: Not on file  Occupational History  . Not on file  Tobacco Use  . Smoking status: Never Smoker  . Smokeless tobacco: Never Used  Substance and Sexual Activity  . Alcohol use: No  . Drug use: No  . Sexual activity: Yes    Birth control/protection: Pill  Other Topics Concern   . Not on file  Social History Narrative  . Not on file   Social Determinants of Health   Financial Resource Strain:   . Difficulty of Paying Living Expenses:   Food Insecurity:   . Worried About Programme researcher, broadcasting/film/video in the Last Year:   . Barista in the Last Year:   Transportation Needs:   . Freight forwarder (Medical):   [name] Lack of Transportation (Non-Medical):   Physical Activity:   . Days of Exercise per Week:   . Minutes of Exercise per Session:   Stress:   . Feeling of Stress :   Social Connections:   . Frequency of Communication with Friends and Family:   . Frequency of Social Gatherings with Friends and Family:   . Attends Religious Services:   . Active Member of Clubs or Organizations:   . Attends Banker Meetings:   [name] Marital Status:   Intimate Partner Violence:   . Fear of Current or Ex-Partner:   . Emotionally Abused:   [name] Physically Abused:   . Sexually Abused:     Outpatient Medications Prior to Visit  Medication Sig Dispense Refill  . albuterol (PROVENTIL) (2.5 MG/3ML) 0.083% nebulizer solution Take 3 mLs (  2.5 mg total) by nebulization every 4 (four) hours as needed for wheezing or shortness of breath (please include nebulizer machine, hoses, and mask if needed.). 30 vial 0  . albuterol (VENTOLIN HFA) 108 (90 Base) MCG/ACT inhaler INHALE 2 PUFFS BY MOUTH INTO THE LUNGS EVERY 6 (SIX) HOURS AS NEEDED FOR WHEEZING OR SHORTNESS OF BREATH. 18 g 2  . beclomethasone (QVAR) 40 MCG/ACT inhaler Inhale 2 puffs into the lungs 2 (two) times daily. 1 Inhaler 2  . montelukast (SINGULAIR) 10 MG tablet Take 1 tablet (10 mg total) by mouth at bedtime. 90 tablet 3  . ondansetron (ZOFRAN ODT) 4 MG disintegrating tablet Take 1 tablet (4 mg total) by mouth every 8 (eight) hours as needed for nausea or vomiting. 12 tablet 1  . rizatriptan (MAXALT) 10 MG tablet Take 1 tablet (10 mg total) by mouth as needed for migraine. May repeat in 2 hours if needed 10 tablet  3  . sertraline (ZOLOFT) 25 MG tablet Take 1 tablet (25 mg total) by mouth daily. OK to increase to 2 tas after 10 days. 60 tablet 0  . amphetamine-dextroamphetamine (ADDERALL XR) 30 MG 24 hr capsule Take 1 capsule (30 mg total) by mouth daily. (Patient not taking: Reported on 11/12/2019) 30 capsule 0   No facility-administered medications prior to visit.    Allergies  Allergen Reactions  . Lexapro [Escitalopram Oxalate] Nausea And Vomiting  . Amoxicillin Rash  . Trintellix [Vortioxetine] Nausea And Vomiting    ROS Review of Systems    Objective:    Physical Exam  Constitutional: She is oriented to person, place, and time. She appears well-developed and well-nourished.  HENT:  Head: Normocephalic and atraumatic.  Cardiovascular: Normal rate, regular rhythm and normal heart sounds.  Pulmonary/Chest: Effort normal and breath sounds normal.  Neurological: She is alert and oriented to person, place, and time.  Skin: Skin is warm and dry.  Psychiatric: She has a normal mood and affect. Her behavior is normal.    BP (!) 94/47   Pulse 79   Ht 5\' 2"  (1.575 m)   Wt 134 lb (60.8 kg)   SpO2 99%   BMI 24.51 kg/m  Wt Readings from Last 3 Encounters:  12/03/19 134 lb (60.8 kg)  09/12/19 134 lb (60.8 kg)  08/27/19 134 lb (60.8 kg)     Health Maintenance Due  Topic Date Due  . PAP-Cervical Cytology Screening  Never done  . PAP SMEAR-Modifier  Never done    There are no preventive care reminders to display for this patient.  Lab Results  Component Value Date   TSH 2.37 04/03/2019   Lab Results  Component Value Date   WBC 6.2 04/03/2019   HGB 13.9 04/03/2019   HCT 40.0 04/03/2019   MCV 92.8 04/03/2019   PLT 150 04/03/2019   Lab Results  Component Value Date   NA 136 04/03/2019   K 4.0 04/03/2019   CO2 24 04/03/2019   GLUCOSE 94 04/03/2019   BUN 9 04/03/2019   CREATININE 0.86 04/03/2019   BILITOT 0.8 04/03/2019   ALKPHOS 60 12/23/2017   AST 15 04/03/2019   ALT  12 04/03/2019   PROT 7.3 04/03/2019   ALBUMIN 5.0 12/23/2017   CALCIUM 9.9 04/03/2019   ANIONGAP 11 12/23/2017   No results found for: CHOL No results found for: HDL No results found for: LDLCALC No results found for: TRIG No results found for: CHOLHDL No results found for: HGBA1C    Assessment & Plan:  Problem List Items Addressed This Visit      Cardiovascular and Mediastinum   Migraine with aura and without status migrainosus, not intractable    Hasn't had a change to try the new triptan. The maxalt was covered.         Relevant Medications   sertraline (ZOLOFT) 50 MG tablet     Other   Generalized anxiety disorder - Primary    Tried and failed Lexapro.  3 weeks on new start sertraline.  Previous PHQ-9 score was 16 and is down to 4 today.  Previous GAD-7 score was 13 and it is down to 5 today.  I think she is had great success so far on the regimen she is currently just taking 50 mg daily.  She would like to try 75mg  if possible.  Will adjust prescription and send new went over.  Plan to follow-up in 6 weeks.       Relevant Medications   sertraline (ZOLOFT) 50 MG tablet      Meds ordered this encounter  Medications  . sertraline (ZOLOFT) 50 MG tablet    Sig: Take 1.5 tablets (75 mg total) by mouth daily. OK to increase to 2 tas after 10 days.    Dispense:  135 tablet    Refill:  0    Follow-up: Return in about 6 weeks (around 01/14/2020) for mood check up.    01/16/2020, MD

## 2019-12-12 ENCOUNTER — Ambulatory Visit: Payer: 59

## 2020-01-14 ENCOUNTER — Ambulatory Visit: Payer: 59 | Admitting: Family Medicine

## 2020-01-14 NOTE — Progress Notes (Deleted)
Established Patient Office Visit  Subjective:  Patient ID: Shannon Bishop, female    DOB: 2001/01/11  Age: 19 y.o. MRN: [id number]  CC: No chief complaint on file.   HPI Shannon Bishop presents for f/u GAD.   Past Medical History:  Diagnosis Date  . ADHD (attention deficit hyperactivity disorder)   . Anxiety   . Asthma   . Depression   . Environmental allergies     No past surgical history on file.  Family History  Problem Relation Age of Onset  . Asthma Mother   . Irritable bowel syndrome Mother   . Hypertension Maternal Grandmother   . Heart disease Maternal Grandmother   . Cancer Maternal Grandmother   . Irritable bowel syndrome Sister     Social History   Socioeconomic History  . Marital status: Single    Spouse name: Not on file  . Number of children: Not on file  . Years of education: Not on file  . Highest education level: Not on file  Occupational History  . Not on file  Tobacco Use  . Smoking status: Never Smoker  . Smokeless tobacco: Never Used  Substance and Sexual Activity  . Alcohol use: No  . Drug use: No  . Sexual activity: Yes    Birth control/protection: Pill  Other Topics Concern  . Not on file  Social History Narrative  . Not on file   Social Determinants of Health   Financial Resource Strain:   . Difficulty of Paying Living Expenses:   Food Insecurity:   . Worried About Programme researcher, broadcasting/film/video in the Last Year:   . Barista in the Last Year:   Transportation Needs:   . Freight forwarder (Medical):   [name] Lack of Transportation (Non-Medical):   Physical Activity:   . Days of Exercise per Week:   . Minutes of Exercise per Session:   Stress:   . Feeling of Stress :   Social Connections:   . Frequency of Communication with Friends and Family:   . Frequency of Social Gatherings with Friends and Family:   . Attends Religious Services:   . Active Member of Clubs or Organizations:   . Attends Banker  Meetings:   [name] Marital Status:   Intimate Partner Violence:   . Fear of Current or Ex-Partner:   . Emotionally Abused:   [name] Physically Abused:   . Sexually Abused:     Outpatient Medications Prior to Visit  Medication Sig Dispense Refill  . albuterol (PROVENTIL) (2.5 MG/3ML) 0.083% nebulizer solution Take 3 mLs (2.5 mg total) by nebulization every 4 (four) hours as needed for wheezing or shortness of breath (please include nebulizer machine, hoses, and mask if needed.). 30 vial 0  . albuterol (VENTOLIN HFA) 108 (90 Base) MCG/ACT inhaler INHALE 2 PUFFS BY MOUTH INTO THE LUNGS EVERY 6 (SIX) HOURS AS NEEDED FOR WHEEZING OR SHORTNESS OF BREATH. 18 g 2  . beclomethasone (QVAR) 40 MCG/ACT inhaler Inhale 2 puffs into the lungs 2 (two) times daily. 1 Inhaler 2  . montelukast (SINGULAIR) 10 MG tablet Take 1 tablet (10 mg total) by mouth at bedtime. 90 tablet 3  . ondansetron (ZOFRAN ODT) 4 MG disintegrating tablet Take 1 tablet (4 mg total) by mouth every 8 (eight) hours as needed for nausea or vomiting. 12 tablet 1  . rizatriptan (MAXALT) 10 MG tablet Take 1 tablet (10 mg total) by mouth as needed for migraine. May repeat  in 2 hours if needed 10 tablet 3  . sertraline (ZOLOFT) 50 MG tablet Take 1.5 tablets (75 mg total) by mouth daily. OK to increase to 2 tas after 10 days. 135 tablet 0   No facility-administered medications prior to visit.    Allergies  Allergen Reactions  . Lexapro [Escitalopram Oxalate] Nausea And Vomiting  . Amoxicillin Rash  . Trintellix [Vortioxetine] Nausea And Vomiting    ROS Review of Systems    Objective:    Physical Exam  There were no vitals taken for this visit. Wt Readings from Last 3 Encounters:  12/03/19 134 lb (60.8 kg)  09/12/19 134 lb (60.8 kg)  08/27/19 134 lb (60.8 kg)     Health Maintenance Due  Topic Date Due  . Hepatitis C Screening  Never done  . HIV Screening  Never done  . CHLAMYDIA SCREENING  05/30/2018  . PAP-Cervical Cytology  Screening  Never done  . PAP SMEAR-Modifier  Never done    There are no preventive care reminders to display for this patient.  Lab Results  Component Value Date   TSH 2.37 04/03/2019   Lab Results  Component Value Date   WBC 6.2 04/03/2019   HGB 13.9 04/03/2019   HCT 40.0 04/03/2019   MCV 92.8 04/03/2019   PLT 150 04/03/2019   Lab Results  Component Value Date   NA 136 04/03/2019   K 4.0 04/03/2019   CO2 24 04/03/2019   GLUCOSE 94 04/03/2019   BUN 9 04/03/2019   CREATININE 0.86 04/03/2019   BILITOT 0.8 04/03/2019   ALKPHOS 60 12/23/2017   AST 15 04/03/2019   ALT 12 04/03/2019   PROT 7.3 04/03/2019   ALBUMIN 5.0 12/23/2017   CALCIUM 9.9 04/03/2019   ANIONGAP 11 12/23/2017   No results found for: CHOL No results found for: HDL No results found for: LDLCALC No results found for: TRIG No results found for: CHOLHDL No results found for: EGBT5V    Assessment & Plan:   Problem List Items Addressed This Visit      Cardiovascular and Mediastinum   Migraine with aura and without status migrainosus, not intractable     Other   Generalized anxiety disorder - Primary      No orders of the defined types were placed in this encounter.   Follow-up: No follow-ups on file.    Nani Gasser, MD

## 2020-01-28 MED FILL — ALBUTEROL SULFATE HFA 108 (: 108 (90 BAS | 25 days supply | Qty: 18 | Fill #2

## 2020-01-29 ENCOUNTER — Ambulatory Visit (INDEPENDENT_AMBULATORY_CARE_PROVIDER_SITE_OTHER): Payer: 59 | Admitting: Family Medicine

## 2020-01-29 VITALS — BP 105/59 | HR 92 | Ht 62.0 in | Wt 135.0 lb

## 2020-01-29 DIAGNOSIS — Z3042 Encounter for surveillance of injectable contraceptive: Secondary | ICD-10-CM | POA: Diagnosis not present

## 2020-01-29 MED ORDER — MEDROXYPROGESTERONE ACETATE 150 MG/ML IM SUSP
150.0000 mg | Freq: Once | INTRAMUSCULAR | Status: AC
  Administered 2020-01-29: 150 mg via INTRAMUSCULAR

## 2020-01-29 NOTE — Progress Notes (Signed)
Agree with documentation as above.   Sanaai Doane, MD  

## 2020-01-29 NOTE — Progress Notes (Signed)
   Subjective:    Patient ID: Shannon Bishop, female    DOB: [birth year], 18 y.o.   MRN: [id number]  HPI  Patient presents for a Depo Provera injection. Denies chest pain, shortness of breath, headaches, mood changes or problems with medication. Patient in appropriate window.   Review of Systems     Objective:   Physical Exam        Assessment & Plan:  Patient tolerated injection well without complications. Per patient request, depo given in right deltoid.  Patient advised to schedule next injection 11-14 weeks from today.  HM: Patient due for PAP, advised her to scheduled CPE with PAP when she is due for next Depo and can address health maintenance at that time.

## 2020-02-09 ENCOUNTER — Emergency Department: Admission: EM | Admit: 2020-02-09 | Discharge status: Absent without leave | Payer: Self-pay

## 2020-02-11 ENCOUNTER — Ambulatory Visit: Payer: 59 | Admitting: Nurse Practitioner

## 2020-02-11 DIAGNOSIS — H66001 Acute suppurative otitis media without spontaneous rupture of ear drum, right ear: Secondary | ICD-10-CM | POA: Diagnosis not present

## 2020-02-11 MED FILL — CEFDINIR 300 MG CAPSULE: 300 | 7 days supply | Qty: 14 | Fill #0

## 2020-02-28 DIAGNOSIS — R42 Dizziness and giddiness: Secondary | ICD-10-CM | POA: Diagnosis not present

## 2020-02-28 DIAGNOSIS — J309 Allergic rhinitis, unspecified: Secondary | ICD-10-CM | POA: Diagnosis not present

## 2020-02-28 MED FILL — ALBUTEROL SULFATE HFA 108 (: 108 (90 BAS | 25 days supply | Qty: 18 | Fill #2

## 2020-02-29 ENCOUNTER — Other Ambulatory Visit: Payer: Self-pay

## 2020-02-29 ENCOUNTER — Emergency Department
Admission: RE | Admit: 2020-02-29 | Discharge: 2020-02-29 | Disposition: A | Discharge status: Home / Self Care | Payer: 59 | Source: Ambulatory Visit | Attending: Emergency Medicine | Admitting: Emergency Medicine

## 2020-02-29 VITALS — BP 103/67 | HR 78 | Temp 98.3°F | Resp 16 | Ht 62.0 in | Wt 130.0 lb

## 2020-02-29 DIAGNOSIS — H8110 Benign paroxysmal vertigo, unspecified ear: Secondary | ICD-10-CM

## 2020-02-29 LAB — POCT FASTING CBG KUC MANUAL ENTRY: POCT Glucose (KUC): 102 mg/dL — AB (ref 70–99)

## 2020-02-29 MED ORDER — MECLIZINE HCL 12.5 MG PO TABS: ORAL_TABLET | ORAL | 0 refills | Status: DC

## 2020-02-29 MED ORDER — ONDANSETRON 4 MG PO TBDP: 4.0000 mg | ORAL_TABLET | Freq: Three times a day (TID) | ORAL | 0 refills | Status: DC | PRN

## 2020-02-29 NOTE — Discharge Instructions (Addendum)
Diagnosis is benign positional vertigo.  No sign of infection, no need for antibiotic. A quick blood sugar test today was normal. Please read attached instruction sheets on vertigo and the "Epley maneuver". I am writing a note excusing from work for 3 days. Rest, drink plenty of fluids.  Avoid excessive motions. Follow-up with your PCP in 5 days for recheck, sooner if worse or new symptoms.

## 2020-02-29 NOTE — ED Triage Notes (Signed)
Patient here with complaints of intermittent dizziness over past 2 days; notices it with changes of position and when lying on right side; did have ear infection in right ear 2 weeks ago and was given antibiotic at [facility]. Was also seen for dizziness at [facility] yesterday and was told she should take zyrtec but she has not done this; no breakfast this morning; steady gait. Has had covid vaccination.

## 2020-02-29 NOTE — ED Provider Notes (Signed)
[facility]    CSN: [id number] Arrival date & time: 02/29/20  1016      History   Chief Complaint Chief Complaint  Patient presents with  . Dizziness    HPI Shannon Bishop is a 19 y.o. female.   HPI  11:37 AM Intermittent dizziness for 2 days.  Shannon Bishop describes it as vertigo and spinning sensation associated with lightheadedness, but no loss of consciousness or focal neurologic symptoms otherwise.  No focal numbness or weakness.  Vertigo is worse with change of position or when lying on right side. Shannon Bishop reports an ear infection in the right ear 2 weeks ago when [facility] and was given an antibiotic. Was also seen for [facility] yesterday was told Shannon Bishop should take Zyrtec. Shannon Bishop has not taken any Zyrtec since seen at [facility]. Shannon Bishop admits Shannon Bishop did not have breakfast this morning, as Shannon Bishop just woke up a little while ago.  Shannon Bishop has had COVID vaccination.  Our nurse did stat blood glucose upon arrival, glucose 102.  Currently, no URI symptoms.  Shannon Bishop feels slight nausea.  No ear pain.  No fever or chills or vomiting.  No chest pain or shortness of breath or cough.  No change in taste or smell.  Pertinent items noted in HPI and remainder of comprehensive ROS otherwise negative. Shannon Bishop denies chance of pregnancy.  Past Medical History:  Diagnosis Date  . ADHD (attention deficit hyperactivity disorder)   . Anxiety   . Asthma   . Depression   . Environmental allergies     Patient Active Problem List   Diagnosis Date Noted  . Migraine with aura and without status migrainosus, not intractable 11/12/2019  . Asthma in adult, moderate persistent, uncomplicated 12/04/2018  . Menorrhagia with regular cycle 05/10/2017  . Skin hypopigmentation 05/10/2017  . Generalized anxiety disorder 01/07/2016  . Major depression 11/26/2015  . ADHD (attention deficit hyperactivity disorder) 06/29/2012  . Chronic seasonal allergic rhinitis 03/15/2010    History  reviewed. No pertinent surgical history.  OB History   No obstetric history on file.      Home Medications    Prior to Admission medications   Medication Sig Start Date End Date Taking? Authorizing Provider  albuterol (PROVENTIL) (2.5 MG/3ML) 0.083% nebulizer solution Take 3 mLs (2.5 mg total) by nebulization every 4 (four) hours as needed for wheezing or shortness of breath (please include nebulizer machine, hoses, and mask if needed.). 05/30/16   Agapito Games, MD  albuterol (VENTOLIN HFA) 108 (90 Base) MCG/ACT inhaler INHALE 2 PUFFS BY MOUTH INTO THE LUNGS EVERY 6 (SIX) HOURS AS NEEDED FOR WHEEZING OR SHORTNESS OF BREATH. 07/18/19   Agapito Games, MD  beclomethasone (QVAR) 40 MCG/ACT inhaler Inhale 2 puffs into the lungs 2 (two) times daily. 11/12/19   Agapito Games, MD  meclizine (ANTIVERT) 12.5 MG tablet Take 1 or 2 every 8 hours as needed for dizziness/vertigo 02/29/20   Lajean Manes, MD  montelukast (SINGULAIR) 10 MG tablet Take 1 tablet (10 mg total) by mouth at bedtime. 07/18/19   Agapito Games, MD  ondansetron (ZOFRAN-ODT) 4 MG disintegrating tablet Take 1 tablet (4 mg total) by mouth every 8 (eight) hours as needed for nausea. 02/29/20   Lajean Manes, MD  rizatriptan (MAXALT) 10 MG tablet Take 1 tablet (10 mg total) by mouth as needed for migraine. May repeat in 2 hours if needed 11/21/19   Agapito Games, MD  sertraline (ZOLOFT) 50  MG tablet Take 1.5 tablets (75 mg total) by mouth daily. OK to increase to 2 tas after 10 days. 12/03/19   Agapito Games, MD    Family History Family History  Problem Relation Age of Onset  . Asthma Mother   . Irritable bowel syndrome Mother   . Hypertension Maternal Grandmother   . Heart disease Maternal Grandmother   . Cancer Maternal Grandmother   . Irritable bowel syndrome Sister     Social History Social History   Tobacco Use  . Smoking status: Never Smoker  . Smokeless tobacco: Never Used    Substance Use Topics  . Alcohol use: No  . Drug use: No     Allergies   Lexapro [escitalopram oxalate], Amoxicillin, and Trintellix [vortioxetine]   Review of Systems Review of Systems   Physical Exam Triage Vital Signs ED Triage Vitals  Enc Vitals Group     BP 02/29/20 1048 103/67     Pulse Rate 02/29/20 1048 78     Resp 02/29/20 1048 16     Temp 02/29/20 1048 98.3 F (36.8 C)     Temp Source 02/29/20 1048 Oral     SpO2 02/29/20 1048 98 %     Weight 02/29/20 1049 130 lb (59 kg)     Height 02/29/20 1049 5\' 2"  (1.575 m)     Head Circumference --      Peak Flow --      Pain Score 02/29/20 1049 1     Pain Loc --      Pain Edu? --      Excl. in GC? --    No data found.  Updated Vital Signs BP 103/67 (BP Location: Right Arm)   Pulse 78   Temp 98.3 F (36.8 C) (Oral)   Resp 16   Ht 5\' 2"  (1.575 m)   Wt 59 kg   SpO2 98%   BMI 23.78 kg/m   Visual Acuity Right Eye Distance:   Left Eye Distance:   Bilateral Distance:    Right Eye Near:   Left Eye Near:    Bilateral Near:     Physical Exam Vitals reviewed.  Constitutional:      General: Shannon Bishop is not in acute distress.    Appearance: Shannon Bishop is well-developed.  HENT:     Head: Normocephalic and atraumatic.     Right Ear: Tympanic membrane normal.     Left Ear: Tympanic membrane normal.     Nose: Nose normal.     Mouth/Throat:     Mouth: Mucous membranes are moist.     Pharynx: Oropharynx is clear. No oropharyngeal exudate or posterior oropharyngeal erythema.  Eyes:     General: No scleral icterus.    Conjunctiva/sclera: Conjunctivae normal.     Pupils: Pupils are equal, round, and reactive to light.  Cardiovascular:     Rate and Rhythm: Normal rate and regular rhythm.  Pulmonary:     Effort: Pulmonary effort is normal.  Abdominal:     General: There is no distension.  Musculoskeletal:        General: No swelling or tenderness.     Cervical back: Normal range of motion and neck supple. No rigidity.      Right lower leg: No edema.     Left lower leg: No edema.  Lymphadenopathy:     Cervical: No cervical adenopathy.  Skin:    General: Skin is warm and dry.     Capillary Refill: Capillary refill takes less  than 2 seconds.     Coloration: Skin is not jaundiced.  Neurological:     General: No focal deficit present.     Mental Status: Shannon Bishop is alert and oriented to person, place, and time.     Cranial Nerves: No cranial nerve deficit.     Sensory: No sensory deficit.     Motor: No weakness.     Coordination: Romberg sign positive.     Comments: Romberg positive. Shannon Bishop can heel toe walk fairly well, but with mild imbalance. Maneuvers to flex and extend and lateral bend the head can reproduce her feeling of vertigo.  Psychiatric:        Behavior: Behavior normal.      UC Treatments / Results  Labs (all labs ordered are listed, but only abnormal results are displayed) Labs Reviewed  POCT FASTING CBG [facility] MANUAL ENTRY - Abnormal; Notable for the following components:      Result Value   POCT Glucose ([facility]) 102 (*)    All other components within normal limits  I interpret this glucose as within normal limits.  EKG   Radiology No results found.  Procedures Procedures (including critical care time)  Medications Ordered in UC Medications - No data to display  Initial Impression / Assessment and Plan / UC Course  I have reviewed the triage vital signs and the nursing notes.  Pertinent labs & imaging results that were available during my care of the patient were reviewed by me and considered in my medical decision making (see chart for details).      Final Clinical Impressions(s) / UC Diagnoses   Final diagnoses:  Benign paroxysmal positional vertigo, unspecified laterality  No evidence of infection or any other acute neurologic event.   Discharge Instructions     Diagnosis is benign positional vertigo.  No sign of infection, no need for antibiotic. A quick blood sugar  test today was normal. Please read attached instruction sheets on vertigo and the "Epley maneuver". I am writing a note excusing from work for 3 days. Rest, drink plenty of fluids.  Avoid excessive motions. Follow-up with your PCP in 5 days for recheck, sooner if worse or new symptoms.    Precautions discussed. Red flags discussed. Questions invited and answered. Patient voiced understanding and agreement.  ED Prescriptions    Medication Sig Dispense Auth. Provider   meclizine (ANTIVERT) 12.5 MG tablet Take 1 or 2 every 8 hours as needed for dizziness/vertigo 20 tablet Lajean Manes, MD   ondansetron (ZOFRAN-ODT) 4 MG disintegrating tablet Take 1 tablet (4 mg total) by mouth every 8 (eight) hours as needed for nausea. 4 tablet Lajean Manes, MD     PDMP not reviewed this encounter.   Lajean Manes, MD 02/29/20 1501

## 2020-03-02 MED FILL — SM ALL DAY ALLERGY-D 5-120: 5-120 | 12 days supply | Qty: 24 | Fill #0

## 2020-03-10 ENCOUNTER — Ambulatory Visit: Payer: 59 | Admitting: Family Medicine

## 2020-03-10 NOTE — Progress Notes (Deleted)
2  Established Patient Office Visit  Subjective:  Patient ID: Shannon Bishop, female    DOB: 2000/10/18  Age: 19 y.o. MRN: [id number]  CC: No chief complaint on file.   HPI NATACIA CHAISSON presents for follow-up recent urgent care visit on August 28 with Dr. Georgina Pillion.  She was diagnosed with benign paroxysmal positional vertigo.  At the time of presentation she complained of intermittent room spinning/dizziness that been going on for about 2 days that was worse with position change.  Prescribed Antivert and Zofran.  Past Medical History:  Diagnosis Date  . ADHD (attention deficit hyperactivity disorder)   . Anxiety   . Asthma   . Depression   . Environmental allergies     No past surgical history on file.  Family History  Problem Relation Age of Onset  . Asthma Mother   . Irritable bowel syndrome Mother   . Hypertension Maternal Grandmother   . Heart disease Maternal Grandmother   . Cancer Maternal Grandmother   . Irritable bowel syndrome Sister     Social History   Socioeconomic History  . Marital status: Single    Spouse name: Not on file  . Number of children: Not on file  . Years of education: Not on file  . Highest education level: Not on file  Occupational History  . Not on file  Tobacco Use  . Smoking status: Never Smoker  . Smokeless tobacco: Never Used  Substance and Sexual Activity  . Alcohol use: No  . Drug use: No  . Sexual activity: Yes    Birth control/protection: Pill  Other Topics Concern  . Not on file  Social History Narrative  . Not on file   Social Determinants of Health   Financial Resource Strain:   . Difficulty of Paying Living Expenses: Not on file  Food Insecurity:   . Worried About Programme researcher, broadcasting/film/video in the Last Year: Not on file  . Ran Out of Food in the Last Year: Not on file  Transportation Needs:   . Lack of Transportation (Medical): Not on file  . Lack of Transportation (Non-Medical): Not on file  Physical Activity:    . Days of Exercise per Week: Not on file  . Minutes of Exercise per Session: Not on file  Stress:   . Feeling of Stress : Not on file  Social Connections:   . Frequency of Communication with Friends and Family: Not on file  . Frequency of Social Gatherings with Friends and Family: Not on file  . Attends Religious Services: Not on file  . Active Member of Clubs or Organizations: Not on file  . Attends Banker Meetings: Not on file  . Marital Status: Not on file  Intimate Partner Violence:   . Fear of Current or Ex-Partner: Not on file  . Emotionally Abused: Not on file  . Physically Abused: Not on file  . Sexually Abused: Not on file    Outpatient Medications Prior to Visit  Medication Sig Dispense Refill  . albuterol (PROVENTIL) (2.5 MG/3ML) 0.083% nebulizer solution Take 3 mLs (2.5 mg total) by nebulization every 4 (four) hours as needed for wheezing or shortness of breath (please include nebulizer machine, hoses, and mask if needed.). 30 vial 0  . albuterol (VENTOLIN HFA) 108 (90 Base) MCG/ACT inhaler INHALE 2 PUFFS BY MOUTH INTO THE LUNGS EVERY 6 (SIX) HOURS AS NEEDED FOR WHEEZING OR SHORTNESS OF BREATH. 18 g 2  . beclomethasone (QVAR) 40 MCG/ACT  inhaler Inhale 2 puffs into the lungs 2 (two) times daily. 1 Inhaler 2  . meclizine (ANTIVERT) 12.5 MG tablet Take 1 or 2 every 8 hours as needed for dizziness/vertigo 20 tablet 0  . montelukast (SINGULAIR) 10 MG tablet Take 1 tablet (10 mg total) by mouth at bedtime. 90 tablet 3  . ondansetron (ZOFRAN-ODT) 4 MG disintegrating tablet Take 1 tablet (4 mg total) by mouth every 8 (eight) hours as needed for nausea. 4 tablet 0  . rizatriptan (MAXALT) 10 MG tablet Take 1 tablet (10 mg total) by mouth as needed for migraine. May repeat in 2 hours if needed 10 tablet 3  . sertraline (ZOLOFT) 50 MG tablet Take 1.5 tablets (75 mg total) by mouth daily. OK to increase to 2 tas after 10 days. 135 tablet 0   No facility-administered  medications prior to visit.    Allergies  Allergen Reactions  . Lexapro [Escitalopram Oxalate] Nausea And Vomiting  . Amoxicillin Rash  . Trintellix [Vortioxetine] Nausea And Vomiting    ROS Review of Systems    Objective:    Physical Exam  There were no vitals taken for this visit. Wt Readings from Last 3 Encounters:  02/29/20 130 lb (59 kg)  01/29/20 135 lb (61.2 kg)  12/03/19 134 lb (60.8 kg)     Health Maintenance Due  Topic Date Due  . Hepatitis C Screening  Never done  . HIV Screening  Never done  . CHLAMYDIA SCREENING  05/30/2018  . PAP-Cervical Cytology Screening  Never done  . PAP SMEAR-Modifier  Never done  . INFLUENZA VACCINE  02/02/2020    There are no preventive care reminders to display for this patient.  Lab Results  Component Value Date   TSH 2.37 04/03/2019   Lab Results  Component Value Date   WBC 6.2 04/03/2019   HGB 13.9 04/03/2019   HCT 40.0 04/03/2019   MCV 92.8 04/03/2019   PLT 150 04/03/2019   Lab Results  Component Value Date   NA 136 04/03/2019   K 4.0 04/03/2019   CO2 24 04/03/2019   GLUCOSE 94 04/03/2019   BUN 9 04/03/2019   CREATININE 0.86 04/03/2019   BILITOT 0.8 04/03/2019   ALKPHOS 60 12/23/2017   AST 15 04/03/2019   ALT 12 04/03/2019   PROT 7.3 04/03/2019   ALBUMIN 5.0 12/23/2017   CALCIUM 9.9 04/03/2019   ANIONGAP 11 12/23/2017   No results found for: CHOL No results found for: HDL No results found for: LDLCALC No results found for: TRIG No results found for: CHOLHDL No results found for: AYTK1S    Assessment & Plan:   Problem List Items Addressed This Visit    None      No orders of the defined types were placed in this encounter.   Follow-up: No follow-ups on file.    Nani Gasser, MD

## 2020-03-18 ENCOUNTER — Encounter: Payer: Self-pay | Admitting: Family Medicine

## 2020-03-18 DIAGNOSIS — J4541 Moderate persistent asthma with (acute) exacerbation: Secondary | ICD-10-CM

## 2020-03-18 DIAGNOSIS — F411 Generalized anxiety disorder: Secondary | ICD-10-CM

## 2020-03-19 ENCOUNTER — Other Ambulatory Visit: Payer: Self-pay | Admitting: Family Medicine

## 2020-03-19 MED ORDER — SERTRALINE HCL 25 MG PO TABS: 75.0000 mg | ORAL_TABLET | Freq: Every day | ORAL | 1 refills | Status: DC

## 2020-03-19 MED ORDER — ALBUTEROL SULFATE HFA 108 (90 BASE) MCG/ACT IN AERS: INHALATION_SPRAY | RESPIRATORY_TRACT | 1 refills | Status: DC

## 2020-03-19 MED FILL — ALBUTEROL SULFATE HFA 108 (: 108 (90 BAS | 50 days supply | Qty: 36 | Fill #0

## 2020-03-19 MED FILL — SERTRALINE HCL 25 MG TABLET: 25 | 90 days supply | Qty: 270 | Fill #0

## 2020-03-19 NOTE — Telephone Encounter (Signed)
Meds updated and refill with changes

## 2020-04-02 ENCOUNTER — Ambulatory Visit (INDEPENDENT_AMBULATORY_CARE_PROVIDER_SITE_OTHER): Payer: 59 | Admitting: Family Medicine

## 2020-04-02 ENCOUNTER — Encounter: Payer: Self-pay | Admitting: Family Medicine

## 2020-04-02 VITALS — BP 95/50 | HR 97 | Ht 62.0 in | Wt 137.0 lb

## 2020-04-02 DIAGNOSIS — Z23 Encounter for immunization: Secondary | ICD-10-CM

## 2020-04-02 DIAGNOSIS — R42 Dizziness and giddiness: Secondary | ICD-10-CM | POA: Diagnosis not present

## 2020-04-02 DIAGNOSIS — M25841 Other specified joint disorders, right hand: Secondary | ICD-10-CM | POA: Diagnosis not present

## 2020-04-02 MED ORDER — PREDNISONE 20 MG PO TABS: 40.0000 mg | ORAL_TABLET | Freq: Every day | ORAL | 0 refills | Status: DC

## 2020-04-02 MED FILL — predniSONE 20 MG TABS: 20 | 5 days supply | Qty: 10 | Fill #0

## 2020-04-02 NOTE — Progress Notes (Signed)
Knot/cyst on R hand anterior palm at middle finger x 1 week. She reports that it bothers her if she goes to grab something

## 2020-04-02 NOTE — Patient Instructions (Signed)
Please call me next week if the prednisone is not helping and I will refer you to physical therapy for vestibular rehab for the vertigo.

## 2020-04-02 NOTE — Progress Notes (Signed)
Acute Office Visit  Subjective:    Patient ID: Shannon Bishop, female    DOB: 2000-11-14, 19 y.o.   MRN: [id number]  Chief Complaint  Patient presents with  . Cyst    HPI Patient is in today for palpable cyst end of the MCP joint of the middle finger on the right hand.  She noticed it about a week ago and says its been a little bit tender just bothering her that it is there.  She is left-handed.  He says it does bother her more at work when she is doing a lot with her hands.  Reports that she still having some vertigo symptoms she mostly notices it when she lays on her right side at night when she is going to bed and occasionally will happen during the day but not very often it started about a month ago after her recent ear infection.   Past Medical History:  Diagnosis Date  . ADHD (attention deficit hyperactivity disorder)   . Anxiety   . Asthma   . Depression   . Environmental allergies     No past surgical history on file.  Family History  Problem Relation Age of Onset  . Asthma Mother   . Irritable bowel syndrome Mother   . Hypertension Maternal Grandmother   . Heart disease Maternal Grandmother   . Cancer Maternal Grandmother   . Irritable bowel syndrome Sister     Social History   Socioeconomic History  . Marital status: Single    Spouse name: Not on file  . Number of children: Not on file  . Years of education: Not on file  . Highest education level: Not on file  Occupational History  . Not on file  Tobacco Use  . Smoking status: Never Smoker  . Smokeless tobacco: Never Used  Substance and Sexual Activity  . Alcohol use: No  . Drug use: No  . Sexual activity: Yes    Birth control/protection: Pill  Other Topics Concern  . Not on file  Social History Narrative  . Not on file   Social Determinants of Health   Financial Resource Strain:   . Difficulty of Paying Living Expenses: Not on file  Food Insecurity:   . Worried About Programme researcher, broadcasting/film/video  in the Last Year: Not on file  . Ran Out of Food in the Last Year: Not on file  Transportation Needs:   . Lack of Transportation (Medical): Not on file  . Lack of Transportation (Non-Medical): Not on file  Physical Activity:   . Days of Exercise per Week: Not on file  . Minutes of Exercise per Session: Not on file  Stress:   . Feeling of Stress : Not on file  Social Connections:   . Frequency of Communication with Friends and Family: Not on file  . Frequency of Social Gatherings with Friends and Family: Not on file  . Attends Religious Services: Not on file  . Active Member of Clubs or Organizations: Not on file  . Attends Banker Meetings: Not on file  . Marital Status: Not on file  Intimate Partner Violence:   . Fear of Current or Ex-Partner: Not on file  . Emotionally Abused: Not on file  . Physically Abused: Not on file  . Sexually Abused: Not on file    Outpatient Medications Prior to Visit  Medication Sig Dispense Refill  . albuterol (PROVENTIL) (2.5 MG/3ML) 0.083% nebulizer solution Take 3 mLs (2.5 mg total)  by nebulization every 4 (four) hours as needed for wheezing or shortness of breath (please include nebulizer machine, hoses, and mask if needed.). 30 vial 0  . albuterol (VENTOLIN HFA) 108 (90 Base) MCG/ACT inhaler INHALE 2 PUFFS BY MOUTH INTO THE LUNGS EVERY 6 (SIX) HOURS AS NEEDED FOR WHEEZING OR SHORTNESS OF BREATH. 36 g 1  . beclomethasone (QVAR) 40 MCG/ACT inhaler Inhale 2 puffs into the lungs 2 (two) times daily. 1 Inhaler 2  . montelukast (SINGULAIR) 10 MG tablet Take 1 tablet (10 mg total) by mouth at bedtime. 90 tablet 3  . rizatriptan (MAXALT) 10 MG tablet Take 1 tablet (10 mg total) by mouth as needed for migraine. May repeat in 2 hours if needed 10 tablet 3  . sertraline (ZOLOFT) 25 MG tablet Take 3 tablets (75 mg total) by mouth daily. 270 tablet 1  . meclizine (ANTIVERT) 12.5 MG tablet Take 1 or 2 every 8 hours as needed for dizziness/vertigo 20  tablet 0  . ondansetron (ZOFRAN-ODT) 4 MG disintegrating tablet Take 1 tablet (4 mg total) by mouth every 8 (eight) hours as needed for nausea. 4 tablet 0   No facility-administered medications prior to visit.    Allergies  Allergen Reactions  . Lexapro [Escitalopram Oxalate] Nausea And Vomiting  . Amoxicillin Rash  . Trintellix [Vortioxetine] Nausea And Vomiting    Review of Systems     Objective:    Physical Exam Vitals reviewed.  Constitutional:      Appearance: She is well-developed.  HENT:     Head: Normocephalic and atraumatic.     Right Ear: External ear normal.     Left Ear: External ear normal.     Nose: Nose normal.  Eyes:     Conjunctiva/sclera: Conjunctivae normal.     Pupils: Pupils are equal, round, and reactive to light.  Neck:     Thyroid: No thyromegaly.  Cardiovascular:     Rate and Rhythm: Normal rate and regular rhythm.     Heart sounds: Normal heart sounds.  Pulmonary:     Effort: Pulmonary effort is normal.     Breath sounds: Normal breath sounds. No wheezing.  Musculoskeletal:     Cervical back: Neck supple.     Comments: smalle 2 mm cyst at the distal end of the MCP joint on the palmar size. Slightly mobile. prfectly round.  Lymphadenopathy:     Cervical: No cervical adenopathy.  Skin:    General: Skin is warm and dry.     Coloration: Skin is not pale.  Neurological:     Mental Status: She is alert and oriented to person, place, and time.  Psychiatric:        Behavior: Behavior normal.     BP (!) 95/50   Pulse 97   Ht 5\' 2"  (1.575 m)   Wt 137 lb (62.1 kg)   SpO2 98%   BMI 25.06 kg/m  Wt Readings from Last 3 Encounters:  04/02/20 137 lb (62.1 kg)  02/29/20 130 lb (59 kg)  01/29/20 135 lb (61.2 kg)    Health Maintenance Due  Topic Date Due  . Hepatitis C Screening  Never done  . HIV Screening  Never done  . CHLAMYDIA SCREENING  05/30/2018  . PAP-Cervical Cytology Screening  Never done  . PAP SMEAR-Modifier  Never done     There are no preventive care reminders to display for this patient.   Lab Results  Component Value Date   TSH 2.37 04/03/2019  Lab Results  Component Value Date   WBC 6.2 04/03/2019   HGB 13.9 04/03/2019   HCT 40.0 04/03/2019   MCV 92.8 04/03/2019   PLT 150 04/03/2019   Lab Results  Component Value Date   NA 136 04/03/2019   K 4.0 04/03/2019   CO2 24 04/03/2019   GLUCOSE 94 04/03/2019   BUN 9 04/03/2019   CREATININE 0.86 04/03/2019   BILITOT 0.8 04/03/2019   ALKPHOS 60 12/23/2017   AST 15 04/03/2019   ALT 12 04/03/2019   PROT 7.3 04/03/2019   ALBUMIN 5.0 12/23/2017   CALCIUM 9.9 04/03/2019   ANIONGAP 11 12/23/2017   No results found for: CHOL No results found for: HDL No results found for: LDLCALC No results found for: TRIG No results found for: CHOLHDL No results found for: MPNT6R     Assessment & Plan:   Problem List Items Addressed This Visit    None    Visit Diagnoses    Need for immunization against influenza    -  Primary   Relevant Orders   Flu Vaccine QUAD 36+ mos IM (Completed)   Cyst of joint of right hand       Vertigo       Relevant Orders   Ambulatory referral to Physical Therapy     Cyst - possible synovial cyst vs on the tendon. Discussed benign nature. Also discussed can monitor or treat.  She would like to have it treated.  Will refer to Dr. Karie Schwalbe for definitive tx.   Vertigo - ear exam normal. Most consistent with BPPV, on right. Thought started after ear infection. Willn tx with round of prednisone to see if may have some residual fluid or pressure in the ear.  Will refer to PT for tx.     Meds ordered this encounter  Medications  . predniSONE (DELTASONE) 20 MG tablet    Sig: Take 2 tablets (40 mg total) by mouth daily with breakfast.    Dispense:  10 tablet    Refill:  0     Nani Gasser, MD

## 2020-04-09 ENCOUNTER — Ambulatory Visit (INDEPENDENT_AMBULATORY_CARE_PROVIDER_SITE_OTHER): Payer: 59

## 2020-04-09 ENCOUNTER — Ambulatory Visit (INDEPENDENT_AMBULATORY_CARE_PROVIDER_SITE_OTHER): Payer: 59 | Admitting: Sports Medicine

## 2020-04-09 DIAGNOSIS — M67441 Ganglion, right hand: Secondary | ICD-10-CM | POA: Diagnosis not present

## 2020-04-09 NOTE — Assessment & Plan Note (Signed)
Right third flexor tendon sheath ganglion cyst, patient declines conservative treatment, so today we performed an injection and fenestration. Hand rehab exercises given, return to see me in a month, we did discuss the high recurrence rate of these.

## 2020-04-09 NOTE — Progress Notes (Signed)
    Procedures performed today:    Procedure: Real-time Ultrasound Guided injection of right third flexor tendon sheath ganglion Device: Samsung [id number]  Verbal informed consent obtained.  Time-out conducted.  Noted no overlying erythema, induration, or other signs of local infection.  Skin prepped in a sterile fashion.  Local anesthesia: Topical Ethyl chloride.  With sterile technique and under real time ultrasound guidance:  1/2 cc lidocaine, 1/2 cc kenalog 40 injected easily, we also performed a few fenestrations of the ganglion cyst as well. completed without difficulty  Pain immediately resolved suggesting accurate placement of the medication.  Advised to call if fevers/chills, erythema, induration, drainage, or persistent bleeding.  Images permanently stored and available for review in PACS.  Impression: Technically successful ultrasound guided injection.  Independent interpretation of notes and tests performed by another provider:   None.  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 Hand rehab exercises given, return to see me in a month, we did discuss the high recurrence rate of these.    ___________________________________________ Ihor Austin. Benjamin Stain, M.D., ABFM., CAQSM. Primary Care and Sports Medicine [facility]  Adjunct Instructor of Family Medicine  [employer]

## 2020-04-09 NOTE — Patient Instructions (Signed)
Ganglion Cyst  A ganglion cyst is a non-cancerous, fluid-filled lump that occurs near a joint or tendon. The cyst grows out of a joint or the lining of a tendon. Ganglion cysts most often develop in the hand or wrist, but they can also develop in the shoulder, elbow, hip, knee, ankle, or foot. Ganglion cysts are ball-shaped or egg-shaped. Their size can range from the size of a pea to larger than a grape. Increased activity may cause the cyst to get bigger because more fluid starts to build up. What are the causes? The exact cause of this condition is not known, but it may be related to:  Inflammation or irritation around the joint.  An injury.  Repetitive movements or overuse.  Arthritis. What increases the risk? You are more likely to develop this condition if:  You are a woman.  You are 15-40 years old. What are the signs or symptoms? The main symptom of this condition is a lump. It most often appears on the hand or wrist. In many cases, there are no other symptoms, but a cyst can sometimes cause:  Tingling.  Pain.  Numbness.  Muscle weakness.  Weak grip.  Less range of motion in a joint. How is this diagnosed? Ganglion cysts are usually diagnosed based on a physical exam. Your health care provider will feel the lump and may shine a light next to it. If it is a ganglion cyst, the light will likely shine through it. Your health care provider may order an X-ray, ultrasound, or MRI to rule out other conditions. How is this treated? Ganglion cysts often go away on their own without treatment. If you have pain or other symptoms, treatment may be needed. Treatment is also needed if the ganglion cyst limits your movement or if it gets infected. Treatment may include:  Wearing a brace or splint on your wrist or finger.  Taking anti-inflammatory medicine.  Having fluid drained from the lump with a needle (aspiration).  Getting a steroid injected into the joint.  Having  surgery to remove the ganglion cyst.  Placing a pad on your shoe or wearing shoes that will not rub against the cyst if it is on your foot. Follow these instructions at home:  Do not press on the ganglion cyst, poke it with a needle, or hit it.  Take over-the-counter and prescription medicines only as told by your health care provider.  If you have a brace or splint: ? Wear it as told by your health care provider. ? Remove it as told by your health care provider. Ask if you need to remove it when you take a shower or a bath.  Watch your ganglion cyst for any changes.  Keep all follow-up visits as told by your health care provider. This is important. Contact a health care provider if:  Your ganglion cyst becomes larger or more painful.  You have pus coming from the lump.  You have weakness or numbness in the affected area.  You have a fever or chills. Get help right away if:  You have a fever and have any of these in the cyst area: ? Increased redness. ? Red streaks. ? Swelling. Summary  A ganglion cyst is a non-cancerous, fluid-filled lump that occurs near a joint or tendon.  Ganglion cysts most often develop in the hand or wrist, but they can also develop in the shoulder, elbow, hip, knee, ankle, or foot.  Ganglion cysts often go away on their own without treatment.   This information is not intended to replace advice given to you by your health care provider. Make sure you discuss any questions you have with your health care provider. Document Revised: 06/02/2017 Document Reviewed: 02/17/2017 Elsevier Patient Education  2020 Elsevier Inc.  

## 2020-04-20 ENCOUNTER — Other Ambulatory Visit: Payer: Self-pay

## 2020-04-20 ENCOUNTER — Ambulatory Visit (INDEPENDENT_AMBULATORY_CARE_PROVIDER_SITE_OTHER): Payer: 59 | Admitting: Family Medicine

## 2020-04-20 ENCOUNTER — Other Ambulatory Visit (HOSPITAL_COMMUNITY)
Admission: RE | Admit: 2020-04-20 | Discharge: 2020-04-20 | Disposition: A | Discharge status: Home / Self Care | Payer: 59 | Source: Ambulatory Visit | Attending: Family Medicine | Admitting: Family Medicine

## 2020-04-20 ENCOUNTER — Encounter: Payer: Self-pay | Admitting: Family Medicine

## 2020-04-20 VITALS — BP 107/47 | HR 62 | Ht 62.0 in | Wt 136.0 lb

## 2020-04-20 DIAGNOSIS — Z3042 Encounter for surveillance of injectable contraceptive: Secondary | ICD-10-CM

## 2020-04-20 DIAGNOSIS — Z124 Encounter for screening for malignant neoplasm of cervix: Secondary | ICD-10-CM | POA: Insufficient documentation

## 2020-04-20 DIAGNOSIS — Z0000 Encounter for general adult medical examination without abnormal findings: Secondary | ICD-10-CM | POA: Diagnosis not present

## 2020-04-20 MED ORDER — MEDROXYPROGESTERONE ACETATE 150 MG/ML IM SUSP
150.0000 mg | Freq: Once | INTRAMUSCULAR | Status: AC
  Administered 2020-04-20: 150 mg via INTRAMUSCULAR

## 2020-04-20 NOTE — Patient Instructions (Addendum)
Return to clinic for Depo 1/3 thru 07/20/2020 for next injection  Preventive Care 21-21 Years Old, Female Preventive care refers to visits with your health care provider and lifestyle choices that can promote health and wellness. This includes:  A yearly physical exam. This may also be called an annual well check.  Regular dental visits and eye exams.  Immunizations.  Screening for certain conditions.  Healthy lifestyle choices, such as eating a healthy diet, getting regular exercise, not using drugs or products that contain nicotine and tobacco, and limiting alcohol use. What can I expect for my preventive care visit? Physical exam Your health care provider will check your:  Height and weight. This may be used to calculate body mass index (BMI), which tells if you are at a healthy weight.  Heart rate and blood pressure.  Skin for abnormal spots. Counseling Your health care provider may ask you questions about your:  Alcohol, tobacco, and drug use.  Emotional well-being.  Home and relationship well-being.  Sexual activity.  Eating habits.  Work and work Statistician.  Method of birth control.  Menstrual cycle.  Pregnancy history. What immunizations do I need?  Influenza (flu) vaccine  This is recommended every year. Tetanus, diphtheria, and pertussis (Tdap) vaccine  You may need a Td booster every 10 years. Varicella (chickenpox) vaccine  You may need this if you have not been vaccinated. Human papillomavirus (HPV) vaccine  If recommended by your health care provider, you may need three doses over 6 months. Measles, mumps, and rubella (MMR) vaccine  You may need at least one dose of MMR. You may also need a second dose. Meningococcal conjugate (MenACWY) vaccine  One dose is recommended if you are age 21-21 years and a first-year college student living in a residence hall, or if you have one of several medical conditions. You may also need additional booster  doses. Pneumococcal conjugate (PCV13) vaccine  You may need this if you have certain conditions and were not previously vaccinated. Pneumococcal polysaccharide (PPSV23) vaccine  You may need one or two doses if you smoke cigarettes or if you have certain conditions. Hepatitis A vaccine  You may need this if you have certain conditions or if you travel or work in places where you may be exposed to hepatitis A. Hepatitis B vaccine  You may need this if you have certain conditions or if you travel or work in places where you may be exposed to hepatitis B. Haemophilus influenzae type b (Hib) vaccine  You may need this if you have certain conditions. You may receive vaccines as individual doses or as more than one vaccine together in one shot (combination vaccines). Talk with your health care provider about the risks and benefits of combination vaccines. What tests do I need?  Blood tests  Lipid and cholesterol levels. These may be checked every 5 years starting at age 21.  Hepatitis C test.  Hepatitis B test. Screening  Diabetes screening. This is done by checking your blood sugar (glucose) after you have not eaten for a while (fasting).  Sexually transmitted disease (STD) testing.  BRCA-related cancer screening. This may be done if you have a family history of breast, ovarian, tubal, or peritoneal cancers.  Pelvic exam and Pap test. This may be done every 3 years starting at age 21. Starting at age 63, this may be done every 5 years if you have a Pap test in combination with an HPV test. Talk with your health care provider about your  test results, treatment options, and if necessary, the need for more tests. Follow these instructions at home: Eating and drinking   Eat a diet that includes fresh fruits and vegetables, whole grains, lean protein, and low-fat dairy.  Take vitamin and mineral supplements as recommended by your health care provider.  Do not drink alcohol  if: ? Your health care provider tells you not to drink. ? You are pregnant, may be pregnant, or are planning to become pregnant.  If you drink alcohol: ? Limit how much you have to 0-1 drink a day. ? Be aware of how much alcohol is in your drink. In the [place] one drink equals one 12 oz bottle of beer (355 mL), one 5 oz glass of wine (148 mL), or one 1 oz glass of hard liquor (44 mL). Lifestyle  Take daily care of your teeth and gums.  Stay active. Exercise for at least 30 minutes on 5 or more days each week.  Do not use any products that contain nicotine or tobacco, such as cigarettes, e-cigarettes, and chewing tobacco. If you need help quitting, ask your health care provider.  If you are sexually active, practice safe sex. Use a condom or other form of birth control (contraception) in order to prevent pregnancy and STIs (sexually transmitted infections). If you plan to become pregnant, see your health care provider for a preconception visit. What's next?  Visit your health care provider once a year for a well check visit.  Ask your health care provider how often you should have your eyes and teeth checked.  Stay up to date on all vaccines. This information is not intended to replace advice given to you by your health care provider. Make sure you discuss any questions you have with your health care provider. Document Revised: 03/01/2018 Document Reviewed: 03/01/2018 Elsevier Patient Education  2020 [employer].

## 2020-04-20 NOTE — Progress Notes (Signed)
Pt is here for a Depo Provera injection. Denies chest pain, shortness of breath, headaches, mood changes or problems with medication. She is within injection window. Tolerated injection today in LD well, no immediate complication. Advised to scheduled next depo appointment. Informed to RTC between 1/3 -07/20/2020 for next injection. No further questions or concerns.

## 2020-04-20 NOTE — Progress Notes (Addendum)
Subjective:     Shannon Bishop is a 19 y.o. female and is here for a comprehensive physical exam. The patient reports no problems. Not currently sexually active.    Social History   Socioeconomic History  . Marital status: Single    Spouse name: Not on file  . Number of children: Not on file  . Years of education: Not on file  . Highest education level: Not on file  Occupational History  . Not on file  Tobacco Use  . Smoking status: Never Smoker  . Smokeless tobacco: Never Used  Substance and Sexual Activity  . Alcohol use: No  . Drug use: No  . Sexual activity: Yes    Birth control/protection: Pill  Other Topics Concern  . Not on file  Social History Narrative  . Not on file   Social Determinants of Health   Financial Resource Strain:   . Difficulty of Paying Living Expenses: Not on file  Food Insecurity:   . Worried About Programme researcher, broadcasting/film/video in the Last Year: Not on file  . Ran Out of Food in the Last Year: Not on file  Transportation Needs:   . Lack of Transportation (Medical): Not on file  . Lack of Transportation (Non-Medical): Not on file  Physical Activity:   . Days of Exercise per Week: Not on file  . Minutes of Exercise per Session: Not on file  Stress:   . Feeling of Stress : Not on file  Social Connections:   . Frequency of Communication with Friends and Family: Not on file  . Frequency of Social Gatherings with Friends and Family: Not on file  . Attends Religious Services: Not on file  . Active Member of Clubs or Organizations: Not on file  . Attends Banker Meetings: Not on file  . Marital Status: Not on file  Intimate Partner Violence:   . Fear of Current or Ex-Partner: Not on file  . Emotionally Abused: Not on file  . Physically Abused: Not on file  . Sexually Abused: Not on file   Health Maintenance  Topic Date Due  . Hepatitis C Screening  Never done  . HIV Screening  Never done  . CHLAMYDIA SCREENING  05/30/2018  .  PAP-Cervical Cytology Screening  Never done  . PAP SMEAR-Modifier  Never done  . TETANUS/TDAP  07/18/2025  . INFLUENZA VACCINE  Completed    The following portions of the patient's history were reviewed and updated as appropriate: allergies, current medications, past family history, past medical history, past social history, past surgical history and problem list.  Review of Systems A comprehensive review of systems was negative.   Objective:    BP (!) 107/47   Pulse 62   Ht 5\' 2"  (1.575 m)   Wt 136 lb (61.7 kg)   LMP 04/16/2020 (Exact Date)   SpO2 100%   BMI 24.87 kg/m  General appearance: alert, cooperative and appears stated age Head: Normocephalic, without obvious abnormality, atraumatic Eyes: conj clear, EOMi, PEERLA Ears: normal TM's and external ear canals both ears Nose: Nares normal. Septum midline. Mucosa normal. No drainage or sinus tenderness. Throat: lips, mucosa, and tongue normal; teeth and gums normal Neck: no adenopathy, no carotid bruit, no JVD, supple, symmetrical, trachea midline and thyroid not enlarged, symmetric, no tenderness/mass/nodules Back: symmetric, no curvature. ROM normal. No CVA tenderness. Lungs: clear to auscultation bilaterally Breasts: normal appearance, no masses or tenderness Heart: regular rate and rhythm, S1, S2 normal, no murmur,  click, rub or gallop Abdomen: soft, non-tender; bowel sounds normal; no masses,  no organomegaly Pelvic: cervix normal in appearance, external genitalia normal, no adnexal masses or tenderness, no cervical motion tenderness, rectovaginal septum normal, uterus normal size, shape, and consistency and vagina normal without discharge Extremities: extremities normal, atraumatic, no cyanosis or edema Pulses: 2+ and symmetric Skin: Skin color, texture, turgor normal. No rashes or lesions Lymph nodes: Cervical, supraclavicular, and axillary nodes normal. Neurologic: Alert and oriented X 3, normal strength and tone.  Normal symmetric reflexes. Normal coordination and gait    Assessment:    Healthy female exam.     Plan:       Keep up a regular exercise program and make sure you are eating a healthy diet Try to eat 4 servings of dairy a day, or if you are lactose intolerant take a calcium with vitamin D daily.  Your vaccines are up to date.   See After Visit Summary for Counseling Recommendations

## 2020-04-23 LAB — CYTOLOGY - PAP
Chlamydia: NEGATIVE
Comment: NEGATIVE
Comment: NORMAL
Diagnosis: NEGATIVE
Neisseria Gonorrhea: NEGATIVE

## 2020-04-23 NOTE — Progress Notes (Signed)
Call patient: Your Pap smear is normal. Repeat in 2-3 years.

## 2020-05-06 ENCOUNTER — Other Ambulatory Visit: Payer: Self-pay

## 2020-05-06 ENCOUNTER — Ambulatory Visit (INDEPENDENT_AMBULATORY_CARE_PROVIDER_SITE_OTHER): Payer: 59 | Admitting: Rehabilitative and Restorative Service Providers"

## 2020-05-06 DIAGNOSIS — R42 Dizziness and giddiness: Secondary | ICD-10-CM

## 2020-05-06 DIAGNOSIS — H8111 Benign paroxysmal vertigo, right ear: Secondary | ICD-10-CM

## 2020-05-06 DIAGNOSIS — R2681 Unsteadiness on feet: Secondary | ICD-10-CM

## 2020-05-06 NOTE — Therapy (Addendum)
[facility] [place] [address] [place], [place], [address] Phone: [phone]   Fax:  [phone]  Physical Therapy Evaluation/ Discharge Summary  Patient Details  Name: Shannon Bishop MRN: [id number] Date of Birth: 12-13-2000 Referring Provider (PT): Nani Gasser, MD    Patient did not return to PT.  Please refer to initial evaluation for patient status. Thank you for the referral of this patient. Margretta Ditty, MPT    Encounter Date: 05/06/2020   PT End of Session - 05/06/20 0837    Visit Number 1    Number of Visits 4    Date for PT Re-Evaluation 06/05/20    Authorization Type [employer] [employer] $20 copay    PT Start Time 0803    PT Stop Time 0839    PT Time Calculation (min) 36 min           Past Medical History:  Diagnosis Date  . ADHD (attention deficit hyperactivity disorder)   . Anxiety   . Asthma   . Depression   . Environmental allergies     No past surgical history on file.  There were no vitals filed for this visit.    Subjective Assessment - 05/06/20 0806    Subjective The patient had an ear infection in June and dizziness began a couple of days later.  She also went to amusement park right after ear infection and feels the rides make it worse.  She reports her initial dizziness was constant in nature and she stayed in bed for a couple of days.  Dizziness has improved somewhat since onset and is now intermittent in nature worse with movement, especially to the right.  At times, she gets symptoms for 30 minutes described as a 'staticy feeling" in which she gets blurred vision and sees stars.  The patient has a h/o migraines and gets 2-3 per month.  The episodes of vision changes are not associated with her migraines, however she does get visual aura before a migraine.  She uses prescription med once migraine symptoms begin and goes to bed.    Pertinent History migraines, asthma    Patient  Stated Goals Reduce dizziness with rolling, reduce #of episodes and duration of episodes (feels off balance at times)    Currently in Pain? No/denies              [facility] PT Assessment - 05/06/20 0814      Assessment   Medical Diagnosis vertigo    Referring Provider (PT) Nani Gasser, MD    Onset Date/Surgical Date 04/02/20   Onset in June   Hand Dominance Left      Precautions   Precautions None      Balance Screen   Has the patient fallen in the past 6 months No    Has the patient had a decrease in activity level because of a fear of falling?  No    Is the patient reluctant to leave their home because of a fear of falling?  No      Home Tourist information centre manager residence      Prior Function   Level of Independence Independent    Vocation Full time employment    Vocation Requirements works in [employer], busy schedule      Observation/Other Assessments   Focus on Therapeutic Outcomes (FOTO)  40% limitation                  Vestibular Assessment -  05/06/20 0815      Vestibular Assessment   General Observation No dizziness at rest today      Symptom Behavior   Subjective history of current problem Began after ear infection + amusement park    Type of Dizziness  Spinning;Imbalance   "like I'm about to faint"   Frequency of Dizziness daily    Duration of Dizziness seconds up to 30 minutes    Symptom Nature Motion provoked;Positional    Aggravating Factors Activity in general;Turning head quickly;Rolling to right    Relieving Factors Head stationary    Progression of Symptoms Better    History of similar episodes None; onset was more constant dizziness post ear infection and amusement park.-- could be vestibular neuritis vs positional vertigo      Oculomotor Exam   Oculomotor Alignment Normal    Ocular ROM WFL    Spontaneous Absent    Gaze-induced  Absent    Smooth Pursuits Intact    Saccades Intact      Vestibulo-Ocular Reflex    VOR 1 Head Only (x 1 viewing) x 1 VOR x 10 reps without difficulty or dizziness    Comment head impulse test= positive for refixation saccade to the Right      Positional Testing   Dix-Hallpike Dix-Hallpike Right;Dix-Hallpike Left    Horizontal Canal Testing Horizontal Canal Right;Horizontal Canal Left      Dix-Hallpike Right   Dix-Hallpike Right Duration 8 second latency with 15 second duration of nystagmus viewed in room light    Dix-Hallpike Right Symptoms Upbeat, right rotatory nystagmus      Dix-Hallpike Left   Dix-Hallpike Left Duration none    Dix-Hallpike Left Symptoms No nystagmus      Horizontal Canal Right   Horizontal Canal Right Duration none    Horizontal Canal Right Symptoms Normal      Horizontal Canal Left   Horizontal Canal Left Duration none    Horizontal Canal Left Symptoms Normal              Objective measurements completed on examination: See above findings.        Vestibular Treatment/Exercise - 05/06/20 9643      Vestibular Treatment/Exercise   Vestibular Treatment Provided Canalith Repositioning;Gaze    Canalith Repositioning Epley Manuever Right    Gaze Exercises X1 Viewing Horizontal       EPLEY MANUEVER RIGHT   Number of Reps  2    Overall Response Symptoms Resolved    Response Details  no nystagmus viewed on second rep and no subjective reports of vertigo      X1 Viewing Horizontal   Foot Position standing     Comments began with 10 reps with cues and will work up to 30 seconds at [employer]                 PT Education - 05/06/20 0837    Education Details HEP for gaze adaptation    Person(s) Educated Patient    Methods Explanation;Demonstration;Handout    Comprehension Verbalized understanding;Returned demonstration               PT Long Term Goals - 05/06/20 8381      PT LONG TERM GOAL #1   Title The patient will be indep with HEP for gaze adaptation.    Time 4    Period Weeks    Target Date 06/05/20      PT  LONG TERM GOAL #2   Title The patient will have  negative positional testing for R BPPV.    Time 4    Period Weeks    Target Date 06/05/20      PT LONG TERM GOAL #3   Title The patient will tolerate gaze x 1 viewing x 30 seconds without dizziness.    Time 4    Period Weeks    Target Date 06/05/20      PT LONG TERM GOAL #4   Title Reduce functional limitation per FOTO from 40% to 16%.    Time 4    Period Weeks    Target Date 06/05/20                  Plan - 05/06/20 0841    Clinical Impression Statement The patient is a 19 yo female s/p ear infection in June followed by onset of constant vertigo that is now intermittent vertigo with position changes.  She has 3 separate type of symptoms:  1) positional vertigo symptoms worse with rolling 2) some visual changes (may be associated with h/o visual aura preceeding migraines) 3) unsteadiness and generalized dizziness that can last x 30 minutes when busy at work.  Due to onset of symptoms initially being constant in nature, she may have multi-factorial dizziness.  Her clinical exam is consistent with R vestibular hypofunction (may be associated with a neuritis post ear infection) and R posterior canalithiasis BPPV.  She has positive R head impulse test and positive R dix hallpike test.  She tolerated Epley's R ear well today without positional symptoms at end of session.  HEP was provided to address gaze adaptation, which is associated with R hypofunction.  PT to address deficits to return to prior status.    Personal Factors and Comorbidities Comorbidity 1    Comorbidities migraines    Examination-Activity Limitations Bed Mobility    Examination-Participation Restrictions Occupation    Stability/Clinical Decision Making Stable/Uncomplicated    Clinical Decision Making Low    Rehab Potential Good    PT Frequency 1x / week    PT Duration 4 weeks    PT Treatment/Interventions Vestibular;Canalith Repostioning;Neuromuscular  re-education;Patient/family education;Visual/perceptual remediation/compensation    PT Next Visit Plan recheck R BPPV and progress gaze adaptation    PT Home Exercise Plan Access Code: [email or web address]    Consulted and Agree with Plan of Care Patient           Patient will benefit from skilled therapeutic intervention in order to improve the following deficits and impairments:  Dizziness, Decreased balance  Visit Diagnosis: BPPV (benign paroxysmal positional vertigo), right  Dizziness and giddiness  Unsteadiness on feet     Problem List Patient Active Problem List   Diagnosis Date Noted  . Ganglion cyst of flexor tendon sheath of finger of right hand 04/09/2020  . Migraine with aura and without status migrainosus, not intractable 11/12/2019  . Asthma in adult, moderate persistent, uncomplicated 12/04/2018  . Menorrhagia with regular cycle 05/10/2017  . Skin hypopigmentation 05/10/2017  . Generalized anxiety disorder 01/07/2016  . Major depression 11/26/2015  . ADHD (attention deficit hyperactivity disorder) 06/29/2012  . Chronic seasonal allergic rhinitis 03/15/2010    Mika Griffitts, PT 05/06/2020, 1:45 PM  [facility] [address] [place], [place], [address] Phone: [phone]   Fax:  [phone]  Name: Shannon Bishop MRN: [id number] Date of Birth: October 07, 2000

## 2020-05-06 NOTE — Patient Instructions (Signed)
Access Code: [email or web address] URL: [email or web address] Date: 05/06/2020 Prepared by: Margretta Ditty  Exercises Standing Gaze Stabilization with Head Rotation - 2 x daily - 7 x weekly - 1 sets - 1 reps - 30 seconds hold

## 2020-05-07 ENCOUNTER — Ambulatory Visit: Payer: 59 | Admitting: Sports Medicine

## 2020-05-14 ENCOUNTER — Encounter: Payer: 59 | Admitting: Rehabilitative and Restorative Service Providers"

## 2020-05-19 MED FILL — ALBUTEROL SULFATE HFA 108 (: 108 (90 BAS | 50 days supply | Qty: 36 | Fill #1

## 2020-05-31 ENCOUNTER — Telehealth: Payer: Self-pay | Admitting: Emergency Medicine

## 2020-05-31 ENCOUNTER — Emergency Department
Admission: EM | Admit: 2020-05-31 | Discharge: 2020-05-31 | Discharge status: Against Medical Advice | Payer: 59 | Source: Home / Self Care

## 2020-05-31 ENCOUNTER — Other Ambulatory Visit: Payer: Self-pay

## 2020-05-31 NOTE — Telephone Encounter (Signed)
RN explained to Shannon Bishop that we needed the vaccination record for the dog that bit her. Shannon Bishop had told the registrar this was the 4th time the dog (rottweiler) had bitten someone. When Shannon Bishop was asked by RN in waiting room if the dog had bitten anyone & Shannon Bishop denied the dog biting anyone prior. The dog is owned by a friend's parents who are out of town, Charity fundraiser explained that Shannon Bishop would need to follow up in the ER if there was not a current vaccine record & report would be made to animal in [facility]. Shannon Bishop not able to provide more details at this time, plans to call vet in am to obtain records. Bandaid noted to left index finger - no active bleeding noted . Pt left prior to triage.

## 2020-06-01 ENCOUNTER — Other Ambulatory Visit: Payer: Self-pay | Admitting: Nurse Practitioner

## 2020-06-01 ENCOUNTER — Encounter: Payer: Self-pay | Admitting: Nurse Practitioner

## 2020-06-01 ENCOUNTER — Ambulatory Visit (INDEPENDENT_AMBULATORY_CARE_PROVIDER_SITE_OTHER): Payer: 59 | Admitting: Nurse Practitioner

## 2020-06-01 ENCOUNTER — Ambulatory Visit (INDEPENDENT_AMBULATORY_CARE_PROVIDER_SITE_OTHER): Payer: 59

## 2020-06-01 VITALS — BP 102/68 | HR 72 | Temp 97.9°F | Ht 62.0 in | Wt 138.6 lb

## 2020-06-01 DIAGNOSIS — S61452A Open bite of left hand, initial encounter: Secondary | ICD-10-CM

## 2020-06-01 DIAGNOSIS — W540XXA Bitten by dog, initial encounter: Secondary | ICD-10-CM

## 2020-06-01 MED ORDER — MUPIROCIN 2 % EX OINT: TOPICAL_OINTMENT | CUTANEOUS | 0 refills | Status: DC

## 2020-06-01 MED ORDER — DOXYCYCLINE HYCLATE 100 MG PO TABS: 100.0000 mg | ORAL_TABLET | Freq: Two times a day (BID) | ORAL | 0 refills | Status: DC

## 2020-06-01 MED FILL — DOXYCYCLINE HYCLATE 100 MG: 100 | 5 days supply | Qty: 10 | Fill #0

## 2020-06-01 MED FILL — MUPIROCIN 2% OINTMENT: 2 | 10 days supply | Qty: 22 | Fill #0

## 2020-06-01 NOTE — Patient Instructions (Signed)
We will xray the hand today and I will contact you and let you know if we need to have you come back in for further examination.   Keep the wound covered with a bandage to keep infection out.    Animal Bite, Adult Animal bites range from mild to serious. An animal bite can result in any of these injuries:  A scratch.  A deep, open cut.  A puncture of the skin.  A crush injury.  Tearing away of the skin or a body part.  A bone injury. A small bite from a house pet is usually less serious than a bite from a stray or wild animal, such as a raccoon, fox, skunk, or bat. That is because stray and wild animals have a higher risk of carrying a serious infection called rabies, which can be passed to humans through a bite. What increases the risk? You are more likely to be bitten by an animal if:  You are around unfamiliar pets.  You disturb an animal when it is eating, sleeping, or caring for its babies.  You are outdoors in a place where small, wild animals roam freely. What are the signs or symptoms? Common symptoms of an animal bite include:  Pain.  Bleeding.  Swelling.  Bruising. How is this diagnosed? This condition may be diagnosed based on a physical exam and medical history. Your health care provider will examine your wound and ask for details about the animal and how the bite happened. You may also have tests, such as:  Blood tests to check for infection.  X-rays to check for damage to bones or joints.  Taking a fluid sample from your wound and checking it for infection (culture test). How is this treated? Treatment varies depending on the type of animal, where the bite is on your body, and your medical history. Treatment may include:  Caring for the wound. This often includes cleaning the wound, rinsing out (flushing) the wound with saline solution, and applying a bandage (dressing). In some cases, the wound may be closed with stitches (sutures), staples, skin glue,  or adhesive strips.  Antibiotic medicine to prevent or treat infection. This medicine may be prescribed in pill or ointment form. If the bite area becomes infected, the medicine may be given through an IV.  A tetanus shot to prevent tetanus infection.  Rabies treatment to prevent rabies infection. This will be done if the animal could have rabies.  Surgery. This may be done if a bite gets infected or if there is damage that needs to be repaired. Follow these instructions at home: Wound care   Follow instructions from your health care provider about how to take care of your wound. Make sure you: ? Wash your hands with soap and water before you change your dressing. If soap and water are not available, use hand sanitizer. ? Change your dressing as told by your health care provider. ? Leave sutures, skin glue, or adhesive strips in place. These skin closures may need to stay in place for 2 weeks or longer. If adhesive strip edges start to loosen and curl up, you may trim the loose edges. Do not remove adhesive strips completely unless your health care provider tells you to do that.  Check your wound every day for signs of infection. Check for: ? More redness, swelling, or pain. ? More fluid or blood. ? Warmth. ? Pus or a bad smell. Medicines  Take or apply over-the-counter and prescription medicines only  as told by your health care provider.  If you were prescribed an antibiotic, take or apply it as told by your health care provider. Do not stop using the antibiotic even if your condition improves. General instructions   Keep the injured area raised (elevated) above the level of your heart while you are sitting or lying down, if this is possible.  If directed, put ice on the injured area. ? Put ice in a plastic bag. ? Place a towel between your skin and the bag. ? Leave the ice on for 20 minutes, 2-3 times per day.  Keep all follow-up visits as told by your health care provider.  This is important. Contact a health care provider if:  You have more redness, swelling, or pain around your wound.  Your wound feels warm to the touch.  You have a fever or chills.  You have a general feeling of sickness (malaise).  You feel nauseous or you vomit.  You have pain that does not get better. Get help right away if:  You have a red streak that leads away from your wound.  You have non-clear fluid or more blood coming from your wound.  There is pus or a bad smell coming from your wound.  You have trouble moving your injured area.  You have numbness or tingling that extends beyond the wound. Summary  Animal bites can range from mild to serious. An animal bite can cause a scratch on the skin, a deep open cut, a puncture of the skin, a crush injury, tearing away of the skin or a body part, or a bone injury.  Your health care provider will examine your wound and ask for details about the animal and how the bite happened.  You may also have tests such as a blood test, X-ray, or testing of a fluid sample from your wound (culture test).  Treatment may include wound care, antibiotic medicine, a tetanus shot, and rabies treatment if the animal could have rabies. This information is not intended to replace advice given to you by your health care provider. Make sure you discuss any questions you have with your health care provider. Document Revised: 06/15/2017 Document Reviewed: 12/29/2016 Elsevier Patient Education  2020 [employer].

## 2020-06-01 NOTE — Progress Notes (Signed)
Acute Office Visit  Subjective:    Patient ID: Shannon Bishop, female    DOB: 01/27/2001, 19 y.o.   MRN: [id number]  Chief Complaint  Patient presents with  . Animal Bite    HPI Shannon Bishop is a 19 year old female presenting today for a dog bite to her left hand that occurred on Saturday night 05/30/2020. She reports she was at a friends house and believes she startled the dog and it bit her on the left hand near the PIP of the thumb and the soft tissue on the medial side of her left hand.  Last tetanus 2017.  She reports the dog has been vaccinated and is up to date on all of it's shots. She does not have copies of the vaccination record, as this is not her dog.  She reports that the dog does not have a history of biting.  She denies fever, chills, body aches, tingling, or red streaks from the wound, generalized redness, or warmth.   She endorses pain (scale 7/10) with movement or grabbing on to something and decreased movement of her thumb. She reports the pain is localized to the lateral (thumb) side and is worse with abduction of the thum and opposition of the thumb.  She also endorses some numbness right around the bite wound on the lateral side, swelling (has improved with time, and serosangiunous drainage from the lateral wound.   The medial wound is closed and healing well with no pain, tenderness, redness, or drainage.   She was seen in the urgent care for the bite on Sunday, 05/31/2020, but reports that she was told to be seen by her PCP.   Past Medical History:  Diagnosis Date  . ADHD (attention deficit hyperactivity disorder)   . Anxiety   . Asthma   . Depression   . Environmental allergies     History reviewed. No pertinent surgical history.  Family History  Problem Relation Age of Onset  . Asthma Mother   . Irritable bowel syndrome Mother   . Hypertension Maternal Grandmother   . Heart disease Maternal Grandmother   . Cancer Maternal Grandmother   .  Irritable bowel syndrome Sister     Social History   Socioeconomic History  . Marital status: Single    Spouse name: Not on file  . Number of children: Not on file  . Years of education: Not on file  . Highest education level: Not on file  Occupational History  . Not on file  Tobacco Use  . Smoking status: Never Smoker  . Smokeless tobacco: Never Used  Substance and Sexual Activity  . Alcohol use: No  . Drug use: No  . Sexual activity: Yes    Birth control/protection: Pill  Other Topics Concern  . Not on file  Social History Narrative  . Not on file   Social Determinants of Health   Financial Resource Strain:   . Difficulty of Paying Living Expenses: Not on file  Food Insecurity:   . Worried About Programme researcher, broadcasting/film/video in the Last Year: Not on file  . Ran Out of Food in the Last Year: Not on file  Transportation Needs:   . Lack of Transportation (Medical): Not on file  . Lack of Transportation (Non-Medical): Not on file  Physical Activity:   . Days of Exercise per Week: Not on file  . Minutes of Exercise per Session: Not on file  Stress:   . Feeling of Stress : Not  on file  Social Connections:   . Frequency of Communication with Friends and Family: Not on file  . Frequency of Social Gatherings with Friends and Family: Not on file  . Attends Religious Services: Not on file  . Active Member of Clubs or Organizations: Not on file  . Attends BankerClub or Organization Meetings: Not on file  . Marital Status: Not on file  Intimate Partner Violence:   . Fear of Current or Ex-Partner: Not on file  . Emotionally Abused: Not on file  . Physically Abused: Not on file  . Sexually Abused: Not on file    Outpatient Medications Prior to Visit  Medication Sig Dispense Refill  . albuterol (PROVENTIL) (2.5 MG/3ML) 0.083% nebulizer solution Take 3 mLs (2.5 mg total) by nebulization every 4 (four) hours as needed for wheezing or shortness of breath (please include nebulizer machine,  hoses, and mask if needed.). 30 vial 0  . albuterol (VENTOLIN HFA) 108 (90 Base) MCG/ACT inhaler INHALE 2 PUFFS BY MOUTH INTO THE LUNGS EVERY 6 (SIX) HOURS AS NEEDED FOR WHEEZING OR SHORTNESS OF BREATH. 36 g 1  . beclomethasone (QVAR) 40 MCG/ACT inhaler Inhale 2 puffs into the lungs 2 (two) times daily. 1 Inhaler 2  . medroxyPROGESTERone (DEPO-PROVERA) 150 MG/ML injection Inject into the muscle every 3 (three) months.    . montelukast (SINGULAIR) 10 MG tablet Take 1 tablet (10 mg total) by mouth at bedtime. 90 tablet 3  . predniSONE (DELTASONE) 20 MG tablet Take 2 tablets (40 mg total) by mouth daily with breakfast. 10 tablet 0  . rizatriptan (MAXALT) 10 MG tablet Take 1 tablet (10 mg total) by mouth as needed for migraine. May repeat in 2 hours if needed 10 tablet 3  . sertraline (ZOLOFT) 25 MG tablet Take 3 tablets (75 mg total) by mouth daily. 270 tablet 1   No facility-administered medications prior to visit.    Allergies  Allergen Reactions  . Lexapro [Escitalopram Oxalate] Nausea And Vomiting  . Amoxicillin Rash  . Trintellix [Vortioxetine] Nausea And Vomiting    Review of Systems All review of systems negative except what is listed in the HPI     Objective:    Physical Exam Vitals and nursing note reviewed.  Constitutional:      Appearance: Normal appearance. She is normal weight.  HENT:     Head: Normocephalic.  Eyes:     Extraocular Movements: Extraocular movements intact.     Conjunctiva/sclera: Conjunctivae normal.     Pupils: Pupils are equal, round, and reactive to light.  Cardiovascular:     Rate and Rhythm: Normal rate and regular rhythm.     Pulses: Normal pulses.  Pulmonary:     Effort: Pulmonary effort is normal.  Musculoskeletal:        General: Swelling, tenderness and signs of injury present.     Right hand: Normal.     Left hand: Swelling, laceration, tenderness and bony tenderness present. No deformity. Decreased range of motion. Decreased strength of  finger abduction and thumb/finger opposition. Normal sensation. There is no disruption of two-point discrimination. Normal capillary refill. Normal pulse.       Arms:     Cervical back: Normal range of motion.  Skin:    General: Skin is warm and dry.     Capillary Refill: Capillary refill takes less than 2 seconds.     Findings: Erythema present.  Neurological:     General: No focal deficit present.     Mental Status:  She is alert and oriented to person, place, and time.     Sensory: No sensory deficit.     Motor: Weakness present.     Deep Tendon Reflexes: Reflexes normal.  Psychiatric:        Mood and Affect: Mood normal.        Behavior: Behavior normal.        Thought Content: Thought content normal.        Judgment: Judgment normal.     BP 102/68   Pulse 72   Temp 97.9 F (36.6 C) (Oral)   Ht 5\' 2"  (1.575 m)   Wt 138 lb 9.6 oz (62.9 kg)   SpO2 100%   BMI 25.35 kg/m  Wt Readings from Last 3 Encounters:  06/01/20 138 lb 9.6 oz (62.9 kg)  04/20/20 136 lb (61.7 kg)  04/02/20 137 lb (62.1 kg)    Health Maintenance Due  Topic Date Due  . Hepatitis C Screening  Never done  . HIV Screening  Never done    There are no preventive care reminders to display for this patient.   Lab Results  Component Value Date   TSH 2.37 04/03/2019   Lab Results  Component Value Date   WBC 6.2 04/03/2019   HGB 13.9 04/03/2019   HCT 40.0 04/03/2019   MCV 92.8 04/03/2019   PLT 150 04/03/2019   Lab Results  Component Value Date   NA 136 04/03/2019   K 4.0 04/03/2019   CO2 24 04/03/2019   GLUCOSE 94 04/03/2019   BUN 9 04/03/2019   CREATININE 0.86 04/03/2019   BILITOT 0.8 04/03/2019   ALKPHOS 60 12/23/2017   AST 15 04/03/2019   ALT 12 04/03/2019   PROT 7.3 04/03/2019   ALBUMIN 5.0 12/23/2017   CALCIUM 9.9 04/03/2019   ANIONGAP 11 12/23/2017   No results found for: CHOL No results found for: HDL No results found for: LDLCALC No results found for: TRIG No results found  for: CHOLHDL No results found for: 12/25/2017     Assessment & Plan:   1. Dog bite of left hand, initial encounter Dog bite with laceration to both the medial and the lateral side of the left hand.   Medial laceration healing well with no signs of infection present today.  Lateral laceration does still appear to be draining serosanguineous fluid with the presence of increased edema and decreased range of motion with the left thumb.   Will obtain x-rays today to rule out any bony abnormality or scaphoid fracture given tenderness over the snuff box and decreased range of motion.  Will treat empirically with doxycycline x 5 days and topical mupirocin BID.  Discussed with patient to keep the area clean and bandaged.  Due to the presence of pain recommend immobilization of the wrist until x-ray results have been received to avoid potential further damage. Recommend continuing using ice to the area at least 3 times a day and monitor for symptoms of worsening infection.  Plan to follow-up in 4-5 days for wound check or sooner if needed or x-ray show abnormality.  - DG Hand Complete Left - doxycycline (VIBRA-TABS) 100 MG tablet; Take 1 tablet (100 mg total) by mouth 2 (two) times daily.  Dispense: 10 tablet; Refill: 0 - mupirocin ointment (BACTROBAN) 2 %; Apply to the wound twice a day for 10 days and cover with clean bandage.  Dispense: 30 g; Refill: 0   Meds ordered this encounter  Medications  . doxycycline (VIBRA-TABS) 100 MG  tablet    Sig: Take 1 tablet (100 mg total) by mouth 2 (two) times daily.    Dispense:  10 tablet    Refill:  0  . mupirocin ointment (BACTROBAN) 2 %    Sig: Apply to the wound twice a day for 10 days and cover with clean bandage.    Dispense:  30 g    Refill:  0     Tollie Eth, NP

## 2020-06-02 NOTE — Progress Notes (Signed)
Shannon Bishop,   The xray does not show any broken bones in the hand, which is great. The swelling and pain are most likely due to the inflammation in the tendon in the hand. Let's plan to keep the follow-up later in the week to make sure things are improving. Keep moving your hand like you were and using ice to the hand at least twice a day. You can also take Ibuprofen 600mg  every 6 hours to help with inflammation.  If anything changes before our next appointment, please let me know! [name]

## 2020-06-04 ENCOUNTER — Encounter: Payer: Self-pay | Admitting: Nurse Practitioner

## 2020-06-04 ENCOUNTER — Telehealth (INDEPENDENT_AMBULATORY_CARE_PROVIDER_SITE_OTHER): Payer: 59 | Admitting: Nurse Practitioner

## 2020-06-04 ENCOUNTER — Other Ambulatory Visit: Payer: Self-pay | Admitting: Nurse Practitioner

## 2020-06-04 VITALS — Temp 97.2°F

## 2020-06-04 DIAGNOSIS — M25542 Pain in joints of left hand: Secondary | ICD-10-CM

## 2020-06-04 MED ORDER — MELOXICAM 15 MG PO TABS: 15.0000 mg | ORAL_TABLET | Freq: Every day | ORAL | 3 refills | Status: DC

## 2020-06-04 MED FILL — MELOXICAM 15 MG TABLET: 15 | 30 days supply | Qty: 30 | Fill #0

## 2020-06-04 NOTE — Progress Notes (Signed)
Virtual Video Visit via MyChart Note  I connected with  Governor Rooks on 06/04/20 at  8:50 AM EST by the video enabled telemedicine application for , MyChart, and verified that I am speaking with the correct person using two identifiers.   I introduced myself as a Publishing rights manager with the practice. We discussed the limitations of evaluation and management by telemedicine and the availability of in person appointments. The patient expressed understanding and agreed to proceed.  Participating parties in this visit include: The patient and nurse practitioner listed only The patient is: at home I am: in the office  Subjective:    CC:  Chief Complaint  Patient presents with  . Animal Bite    follow up    HPI: Shannon Bishop is a 19 y.o. y/o female presenting via MyChart today for follow-up for left thumb pain related to dog bite that occurred last Saturday evening. She reports that since using the antibiotics the pain and swelling have decreased some. She also reports that the redness has resolved and the bite is no longer oozing and appears to be healing nicely.   She does report that she is having continued pain with movement of the left thumb. She states that she has been icing the area nightly and taking ibuprofen for pain daily. She tells me that she did buy a spica splint and immobilization of the thumb decreases the pain significantly. She has also been doing hand exercises daily.   Her pain is 7/10 at it's worst and 0/10 when splinted and not using the thumb.   Past medical history, Surgical history, Family history not pertinant except as noted below, Social history, Allergies, and medications have been entered into the medical record, reviewed, and corrections made.   Review of Systems:  All review of systems negative except what is listed in the HPI   Objective:    General: Speaking clearly in complete sentences without any shortness of breath.   Alert and oriented x3.    Normal judgment.  No apparent acute distress. Minimal inflammation to the left hand at the base of the thumb.   Impression and Recommendations:    1. Pain in thumb joint with movement of left hand Strongly suspect tendon injury to the abductor pollicis longus or extensor pollicis brevis due to bite from animal. No signs of inflammation that would indicate complete tear. Hand x-rays earlier this week do not show any fracture of the bones within the hand. ROM is present, but limited by pain.  Recommend continued ice and immobilization of the thumb. Meloxicam daily for at least the next 7-10 days and stop all use of other NSAIDs. Recommend twice daily exercises for the hand.  Discussed with patient if the current regimen is showing improvement over the next 7 days then continue plan for up to 4 weeks. If at 4 weeks, pain and ROM limitations are continued- follow-up with Dr. Karie Schwalbe with sports medicine. If NO improvement over the next 7 days with the above plan, recommend follow-up with Dr. Karie Schwalbe earlier.  - meloxicam (MOBIC) 15 MG tablet; Take 1 tablet (15 mg total) by mouth daily.  Dispense: 30 tablet; Refill: 3     I discussed the assessment and treatment plan with the patient. The patient was provided an opportunity to ask questions and all were answered. The patient agreed with the plan and demonstrated an understanding of the instructions.   The patient was advised to call back or seek an in-person evaluation  if the symptoms worsen or if the condition fails to improve as anticipated.  I provided 23 minutes of non-face-to-face interaction with this MYCHART visit including intake, same-day documentation, and chart review.   Tollie Eth, NP

## 2020-06-04 NOTE — Patient Instructions (Addendum)
It appears that you have damage to the tendon that controls your thumb movement. This could be a small tear or sprain to the tendon that will just take time to heal completely.  The name of the two tendons affected in that area are the  "abductor pollicis longus" and the "extensor pollicis brevus".  The kind of injuries can be very painful while they heal.    I have called in a medication called Meloxicam for you to take once every day for the next week. This will help with pain and inflammation. Do not take Ibuprofen, aspirin, motirin, or advil with this medication because they are in the same family.   I would like you to wear the splint that you have every day for the next week. You can take it off at night if you would like, but be sure you at least wear it during the day.   I would like for you to ice the hand twice a day for the next week and do the exercises twice a day. I will find some more exercises and send these through mychart. I have one listed below.   If your pain and mobility are improving in the next week, then continue the same plan as above.   If your pain and/or mobility is not improved in the next week or if it improves, but has not resolved in 4 weeks, then please call the office and schedule an appointment with Dr. Karie Schwalbe for more evaluation.   Exercise: One exercise which strengthens the abductor pollicis longus can be done using a rubber band. Loop a medium-sized rubber band over both your thumbs. Push against the band by extending one thumb upwards and away from your palm. Bring the raised thumb back down slowly. For optimal strengthening results, perform 15 repetitions of this exercise, three times per week

## 2020-07-06 ENCOUNTER — Ambulatory Visit: Payer: 59

## 2020-07-10 ENCOUNTER — Other Ambulatory Visit: Payer: Self-pay

## 2020-07-10 ENCOUNTER — Ambulatory Visit (INDEPENDENT_AMBULATORY_CARE_PROVIDER_SITE_OTHER): Payer: 59 | Admitting: Family Medicine

## 2020-07-10 ENCOUNTER — Other Ambulatory Visit: Payer: Self-pay | Admitting: Family Medicine

## 2020-07-10 VITALS — BP 108/68 | HR 72

## 2020-07-10 DIAGNOSIS — J4541 Moderate persistent asthma with (acute) exacerbation: Secondary | ICD-10-CM

## 2020-07-10 DIAGNOSIS — Z3042 Encounter for surveillance of injectable contraceptive: Secondary | ICD-10-CM | POA: Diagnosis not present

## 2020-07-10 MED ORDER — MEDROXYPROGESTERONE ACETATE 150 MG/ML IM SUSP
150.0000 mg | Freq: Once | INTRAMUSCULAR | Status: AC
  Administered 2020-07-10: 150 mg via INTRAMUSCULAR

## 2020-07-10 MED FILL — ALBUTEROL SULFATE HFA 108 (: 108 (90 BAS | 50 days supply | Qty: 36 | Fill #0

## 2020-07-10 NOTE — Progress Notes (Signed)
Established Patient Office Visit  Subjective:  Patient ID: Shannon Bishop, female    DOB: Nov 05, 2000  Age: 20 y.o. MRN: [id number]  CC:  Chief Complaint  Patient presents with  . Contraception    HPI Shannon Bishop is here for a Depo Provera injection. Denies chest pain, shortness of breath, headaches, mood changes or problems with medication. It has been < 14 weeks since her last Depo Provera injection.    Past Medical History:  Diagnosis Date  . ADHD (attention deficit hyperactivity disorder)   . Anxiety   . Asthma   . Depression   . Environmental allergies     History reviewed. No pertinent surgical history.  Family History  Problem Relation Age of Onset  . Asthma Mother   . Irritable bowel syndrome Mother   . Hypertension Maternal Grandmother   . Heart disease Maternal Grandmother   . Cancer Maternal Grandmother   . Irritable bowel syndrome Sister     Social History   Socioeconomic History  . Marital status: Single    Spouse name: Not on file  . Number of children: Not on file  . Years of education: Not on file  . Highest education level: Not on file  Occupational History  . Not on file  Tobacco Use  . Smoking status: Never Smoker  . Smokeless tobacco: Never Used  Substance and Sexual Activity  . Alcohol use: No  . Drug use: No  . Sexual activity: Yes    Birth control/protection: Pill  Other Topics Concern  . Not on file  Social History Narrative  . Not on file   Social Determinants of Health   Financial Resource Strain: Not on file  Food Insecurity: Not on file  Transportation Needs: Not on file  Physical Activity: Not on file  Stress: Not on file  Social Connections: Not on file  Intimate Partner Violence: Not on file    Outpatient Medications Prior to Visit  Medication Sig Dispense Refill  . albuterol (PROVENTIL) (2.5 MG/3ML) 0.083% nebulizer solution Take 3 mLs (2.5 mg total) by nebulization every 4 (four) hours as needed for  wheezing or shortness of breath (please include nebulizer machine, hoses, and mask if needed.). 30 vial 0  . albuterol (VENTOLIN HFA) 108 (90 Base) MCG/ACT inhaler INHALE 2 PUFFS BY MOUTH INTO THE LUNGS EVERY 6 (SIX) HOURS AS NEEDED FOR WHEEZING OR SHORTNESS OF BREATH. 36 g 1  . beclomethasone (QVAR) 40 MCG/ACT inhaler Inhale 2 puffs into the lungs 2 (two) times daily. 1 Inhaler 2  . doxycycline (VIBRA-TABS) 100 MG tablet Take 1 tablet (100 mg total) by mouth 2 (two) times daily. 10 tablet 0  . medroxyPROGESTERone (DEPO-PROVERA) 150 MG/ML injection Inject into the muscle every 3 (three) months.    . meloxicam (MOBIC) 15 MG tablet Take 1 tablet (15 mg total) by mouth daily. 30 tablet 3  . montelukast (SINGULAIR) 10 MG tablet Take 1 tablet (10 mg total) by mouth at bedtime. 90 tablet 3  . mupirocin ointment (BACTROBAN) 2 % Apply to the wound twice a day for 10 days and cover with clean bandage. 30 g 0  . predniSONE (DELTASONE) 20 MG tablet Take 2 tablets (40 mg total) by mouth daily with breakfast. 10 tablet 0  . rizatriptan (MAXALT) 10 MG tablet Take 1 tablet (10 mg total) by mouth as needed for migraine. May repeat in 2 hours if needed 10 tablet 3  . sertraline (ZOLOFT) 25 MG tablet Take 3 tablets (  75 mg total) by mouth daily. 270 tablet 1   No facility-administered medications prior to visit.    Allergies  Allergen Reactions  . Lexapro [Escitalopram Oxalate] Nausea And Vomiting  . Amoxicillin Rash  . Trintellix [Vortioxetine] Nausea And Vomiting    ROS Review of Systems    Objective:    Physical Exam  BP 108/68   Pulse 72   SpO2 99%  Wt Readings from Last 3 Encounters:  06/01/20 138 lb 9.6 oz (62.9 kg)  04/20/20 136 lb (61.7 kg)  04/02/20 137 lb (62.1 kg)     There are no preventive care reminders to display for this patient.  There are no preventive care reminders to display for this patient.  Lab Results  Component Value Date   TSH 2.37 04/03/2019   Lab Results   Component Value Date   WBC 6.2 04/03/2019   HGB 13.9 04/03/2019   HCT 40.0 04/03/2019   MCV 92.8 04/03/2019   PLT 150 04/03/2019   Lab Results  Component Value Date   NA 136 04/03/2019   K 4.0 04/03/2019   CO2 24 04/03/2019   GLUCOSE 94 04/03/2019   BUN 9 04/03/2019   CREATININE 0.86 04/03/2019   BILITOT 0.8 04/03/2019   ALKPHOS 60 12/23/2017   AST 15 04/03/2019   ALT 12 04/03/2019   PROT 7.3 04/03/2019   ALBUMIN 5.0 12/23/2017   CALCIUM 9.9 04/03/2019   ANIONGAP 11 12/23/2017   No results found for: CHOL No results found for: HDL No results found for: LDLCALC No results found for: TRIG No results found for: CHOLHDL No results found for: FYBO1B    Assessment & Plan:  Contraception - Patient tolerated injection well without complications. Patient advised to schedule next injection between March 25 - April 8 th.   Problem List Items Addressed This Visit   None   Visit Diagnoses    Encounter for Depo-Provera contraception    -  Primary   Relevant Medications   medroxyPROGESTERone (DEPO-PROVERA) injection 150 mg (Completed)      Meds ordered this encounter  Medications  . medroxyPROGESTERone (DEPO-PROVERA) injection 150 mg    Follow-up: Return in about 12 weeks (around 10/02/2020) for Depo Provera injection. Earna Coder, Janalyn Harder, CMA

## 2020-07-10 NOTE — Progress Notes (Signed)
Agree with documentation as above.   Markhi Kleckner, MD  

## 2020-08-20 ENCOUNTER — Encounter: Payer: Self-pay | Admitting: Family Medicine

## 2020-08-20 ENCOUNTER — Other Ambulatory Visit: Payer: Self-pay | Admitting: Family Medicine

## 2020-08-20 ENCOUNTER — Telehealth (INDEPENDENT_AMBULATORY_CARE_PROVIDER_SITE_OTHER): Payer: 59 | Admitting: Family Medicine

## 2020-08-20 DIAGNOSIS — J4540 Moderate persistent asthma, uncomplicated: Secondary | ICD-10-CM | POA: Diagnosis not present

## 2020-08-20 DIAGNOSIS — J4541 Moderate persistent asthma with (acute) exacerbation: Secondary | ICD-10-CM | POA: Diagnosis not present

## 2020-08-20 MED ORDER — PREDNISONE 20 MG PO TABS: 40.0000 mg | ORAL_TABLET | Freq: Every day | ORAL | 0 refills | Status: DC

## 2020-08-20 MED ORDER — ALBUTEROL SULFATE (2.5 MG/3ML) 0.083% IN NEBU: 2.5000 mg | INHALATION_SOLUTION | Freq: Four times a day (QID) | RESPIRATORY_TRACT | 0 refills | Status: DC | PRN

## 2020-08-20 MED ORDER — FLOVENT HFA 110 MCG/ACT IN AERO: 2.0000 | INHALATION_SPRAY | Freq: Two times a day (BID) | RESPIRATORY_TRACT | 2 refills | Status: DC

## 2020-08-20 MED ORDER — ALBUTEROL SULFATE HFA 108 (90 BASE) MCG/ACT IN AERS: INHALATION_SPRAY | RESPIRATORY_TRACT | 1 refills | Status: DC

## 2020-08-20 MED FILL — predniSONE 20 MG TABS: 20 | 5 days supply | Qty: 10 | Fill #0

## 2020-08-20 MED FILL — FLOVENT HFA 110 MCG INHALER: 110 | 30 days supply | Qty: 12 | Fill #0

## 2020-08-20 MED FILL — ALBUTEROL SULFATE HFA 108 (: 108 (90 BAS | 50 days supply | Qty: 36 | Fill #0

## 2020-08-20 NOTE — Progress Notes (Signed)
Virtual Visit via Video Note  I connected with Governor Rooks on 08/20/20 at  2:20 PM EST by a video enabled telemedicine application and verified that I am speaking with the correct person using two identifiers.   I discussed the limitations of evaluation and management by telemedicine and the availability of in person appointments. The patient expressed understanding and agreed to proceed.  Patient location: at home  Provider location: in office  Subjective:    CC: Wheezing   HPI: 4 days wheezing x 4 days.  Worse with activity. Using rescue inhaler.  No other URI sxs.  Chest tightness.  Ventolin 2 puffs and some nebulizer treatment.  Not on Qvar.  No GI sxs.  No heartburn. No known triggers such as smoke or chemical closure.  She says she really has not been around anything different or unusual.  She does not feel like she has any other GI symptoms etc.   Past medical history, Surgical history, Family history not pertinant except as noted below, Social history, Allergies, and medications have been entered into the medical record, reviewed, and corrections made.   Review of Systems: No fevers, chills, night sweats, weight loss, chest pain, or shortness of breath.   Objective:    General: Speaking clearly in complete sentences without any shortness of breath.  Alert and oriented x3.  Normal judgment. No apparent acute distress.    Impression and Recommendations:    Asthma in adult, moderate persistent, uncomplicated Asthma exacerbation.  We will treat with oral prednisone since she has been actively wheezing and using her albuterol for 4 days.  No indication of underlying infection.  We will have her restart inhaled corticosteroid.  She said that the previous Qvar is no longer covered by her insurance so we will switch to Flovent.  She also needs a refill on her albuterol as well as her nebulizer vials.  Call if not significantly better after the weekend.  Also encouraged her to  liberally use the albuterol for the next few days and then taper as she is doing better.  Make sure hydrating well.   Meds ordered this encounter  Medications  . fluticasone (FLOVENT HFA) 110 MCG/ACT inhaler    Sig: Inhale 2 puffs into the lungs in the morning and at bedtime.    Dispense:  1 each    Refill:  2  . albuterol (VENTOLIN HFA) 108 (90 Base) MCG/ACT inhaler    Sig: INHALE 2 PUFFS BY MOUTH INTO THE LUNGS EVERY 6 (SIX) HOURS AS NEEDED FOR WHEEZING OR SHORTNESS OF BREATH.    Dispense:  36 g    Refill:  1  . predniSONE (DELTASONE) 20 MG tablet    Sig: Take 2 tablets (40 mg total) by mouth daily with breakfast.    Dispense:  10 tablet    Refill:  0  . albuterol (PROVENTIL) (2.5 MG/3ML) 0.083% nebulizer solution    Sig: Take 3 mLs (2.5 mg total) by nebulization every 6 (six) hours as needed for wheezing or shortness of breath (please include nebulizer machine, hoses, and mask if needed.).    Dispense:  60 mL    Refill:  0      Time spent in encounter 21 minutes  I discussed the assessment and treatment plan with the patient. The patient was provided an opportunity to ask questions and all were answered. The patient agreed with the plan and demonstrated an understanding of the instructions.   The patient was advised to call back  or seek an in-person evaluation if the symptoms worsen or if the condition fails to improve as anticipated.   Nani Gasser, MD

## 2020-08-20 NOTE — Assessment & Plan Note (Signed)
Asthma exacerbation.  We will treat with oral prednisone since she has been actively wheezing and using her albuterol for 4 days.  No indication of underlying infection.  We will have her restart inhaled corticosteroid.  She said that the previous Qvar is no longer covered by her insurance so we will switch to Flovent.  She also needs a refill on her albuterol as well as her nebulizer vials.  Call if not significantly better after the weekend.  Also encouraged her to liberally use the albuterol for the next few days and then taper as she is doing better.  Make sure hydrating well.

## 2020-09-25 ENCOUNTER — Ambulatory Visit: Payer: 59

## 2020-09-29 ENCOUNTER — Ambulatory Visit (INDEPENDENT_AMBULATORY_CARE_PROVIDER_SITE_OTHER): Payer: 59 | Admitting: Osteopathic Medicine

## 2020-09-29 ENCOUNTER — Other Ambulatory Visit: Payer: Self-pay

## 2020-09-29 VITALS — BP 109/63 | HR 73 | Resp 20 | Ht 62.0 in | Wt 138.0 lb

## 2020-09-29 DIAGNOSIS — Z3042 Encounter for surveillance of injectable contraceptive: Secondary | ICD-10-CM

## 2020-09-29 MED ORDER — MEDROXYPROGESTERONE ACETATE 150 MG/ML IM SUSP
150.0000 mg | Freq: Once | INTRAMUSCULAR | Status: AC
  Administered 2020-09-29: 150 mg via INTRAMUSCULAR

## 2020-09-29 MED FILL — ALBUTEROL SULFATE HFA 108 (: 108 (90 BAS | 50 days supply | Qty: 36 | Fill #1

## 2020-09-29 NOTE — Progress Notes (Signed)
Established Patient Office Visit  Subjective:  Patient ID: Shannon Bishop, female    DOB: [birth year]  Age: 20 y.o. MRN: [id number]  CC:  Chief Complaint  Patient presents with  . Contraception    HPI VERNIS CABACUNGAN presents for a Depo Provera injection. Denies chest pain, shortness of breath, headaches, mood changes or problems with medication. Depo given in left deltoid (pt request). Pt tolerated well without any apparent complications. Pt will return in 3 months for next injection.    Past Medical History:  Diagnosis Date  . ADHD (attention deficit hyperactivity disorder)   . Anxiety   . Asthma   . Depression   . Environmental allergies     History reviewed. No pertinent surgical history.  Family History  Problem Relation Age of Onset  . Asthma Mother   . Irritable bowel syndrome Mother   . Hypertension Maternal Grandmother   . Heart disease Maternal Grandmother   . Cancer Maternal Grandmother   . Irritable bowel syndrome Sister     Social History   Socioeconomic History  . Marital status: Single    Spouse name: Not on file  . Number of children: Not on file  . Years of education: Not on file  . Highest education level: Not on file  Occupational History  . Not on file  Tobacco Use  . Smoking status: Never Smoker  . Smokeless tobacco: Never Used  Substance and Sexual Activity  . Alcohol use: No  . Drug use: No  . Sexual activity: Yes    Birth control/protection: Pill  Other Topics Concern  . Not on file  Social History Narrative  . Not on file   Social Determinants of Health   Financial Resource Strain: Not on file  Food Insecurity: Not on file  Transportation Needs: Not on file  Physical Activity: Not on file  Stress: Not on file  Social Connections: Not on file  Intimate Partner Violence: Not on file    Outpatient Medications Prior to Visit  Medication Sig Dispense Refill  . albuterol (PROVENTIL) (2.5 MG/3ML) 0.083% nebulizer solution  Take 3 mLs (2.5 mg total) by nebulization every 6 (six) hours as needed for wheezing or shortness of breath (please include nebulizer machine, hoses, and mask if needed.). 60 mL 0  . albuterol (VENTOLIN HFA) 108 (90 Base) MCG/ACT inhaler INHALE 2 PUFFS BY MOUTH INTO THE LUNGS EVERY 6 (SIX) HOURS AS NEEDED FOR WHEEZING OR SHORTNESS OF BREATH. 36 g 1  . doxycycline (VIBRA-TABS) 100 MG tablet Take 1 tablet (100 mg total) by mouth 2 (two) times daily. 10 tablet 0  . fluticasone (FLOVENT HFA) 110 MCG/ACT inhaler Inhale 2 puffs into the lungs in the morning and at bedtime. 1 each 2  . medroxyPROGESTERone (DEPO-PROVERA) 150 MG/ML injection Inject into the muscle every 3 (three) months.    . meloxicam (MOBIC) 15 MG tablet Take 1 tablet (15 mg total) by mouth daily. 30 tablet 3  . montelukast (SINGULAIR) 10 MG tablet Take 1 tablet (10 mg total) by mouth at bedtime. 90 tablet 3  . mupirocin ointment (BACTROBAN) 2 % Apply to the wound twice a day for 10 days and cover with clean bandage. 30 g 0  . predniSONE (DELTASONE) 20 MG tablet Take 2 tablets (40 mg total) by mouth daily with breakfast. 10 tablet 0  . rizatriptan (MAXALT) 10 MG tablet Take 1 tablet (10 mg total) by mouth as needed for migraine. May repeat in 2 hours if needed  10 tablet 3  . sertraline (ZOLOFT) 25 MG tablet Take 3 tablets (75 mg total) by mouth daily. 270 tablet 1   No facility-administered medications prior to visit.    Allergies  Allergen Reactions  . Lexapro [Escitalopram Oxalate] Nausea And Vomiting  . Amoxicillin Rash  . Trintellix [Vortioxetine] Nausea And Vomiting    ROS Review of Systems    Objective:    Physical Exam  BP 109/63 (BP Location: Left Arm, Patient Position: Sitting, Cuff Size: Normal)   Pulse 73   Resp 20   Ht 5\' 2"  (1.575 m)   Wt 138 lb (62.6 kg)   SpO2 100%   BMI 25.24 kg/m  Wt Readings from Last 3 Encounters:  09/29/20 138 lb (62.6 kg)  06/01/20 138 lb 9.6 oz (62.9 kg)  04/20/20 136 lb (61.7  kg)     Health Maintenance Due  Topic Date Due  . HPV VACCINES (1 - 2-dose series) Never done       Topic Date Due  . HPV VACCINES (1 - 2-dose series) Never done    Lab Results  Component Value Date   TSH 2.37 04/03/2019   Lab Results  Component Value Date   WBC 6.2 04/03/2019   HGB 13.9 04/03/2019   HCT 40.0 04/03/2019   MCV 92.8 04/03/2019   PLT 150 04/03/2019   Lab Results  Component Value Date   NA 136 04/03/2019   K 4.0 04/03/2019   CO2 24 04/03/2019   GLUCOSE 94 04/03/2019   BUN 9 04/03/2019   CREATININE 0.86 04/03/2019   BILITOT 0.8 04/03/2019   ALKPHOS 60 12/23/2017   AST 15 04/03/2019   ALT 12 04/03/2019   PROT 7.3 04/03/2019   ALBUMIN 5.0 12/23/2017   CALCIUM 9.9 04/03/2019   ANIONGAP 11 12/23/2017   No results found for: CHOL No results found for: HDL No results found for: LDLCALC No results found for: TRIG No results found for: CHOLHDL No results found for: 12/25/2017    Assessment & Plan:  Depo given in left deltoid (pt request). Pt tolerated well without any apparent complications. Pt will return in 3 months for next injection.  Problem List Items Addressed This Visit   None   Visit Diagnoses    Encounter for Depo-Provera contraception    -  Primary   Relevant Medications   medroxyPROGESTERone (DEPO-PROVERA) injection 150 mg (Completed) (Start on 09/29/2020  2:00 PM)      Meds ordered this encounter  Medications  . medroxyPROGESTERone (DEPO-PROVERA) injection 150 mg    Follow-up: Return in about 3 months (around 12/30/2020) for Depo-Provera Injection.    01/01/2021, CMA

## 2020-10-29 ENCOUNTER — Other Ambulatory Visit: Payer: Self-pay | Admitting: Family Medicine

## 2020-10-29 ENCOUNTER — Other Ambulatory Visit (HOSPITAL_BASED_OUTPATIENT_CLINIC_OR_DEPARTMENT_OTHER): Payer: Self-pay

## 2020-10-29 DIAGNOSIS — J4541 Moderate persistent asthma with (acute) exacerbation: Secondary | ICD-10-CM

## 2020-11-03 ENCOUNTER — Other Ambulatory Visit (HOSPITAL_BASED_OUTPATIENT_CLINIC_OR_DEPARTMENT_OTHER): Payer: Self-pay

## 2020-11-04 ENCOUNTER — Other Ambulatory Visit (HOSPITAL_BASED_OUTPATIENT_CLINIC_OR_DEPARTMENT_OTHER): Payer: Self-pay

## 2020-11-06 ENCOUNTER — Other Ambulatory Visit (HOSPITAL_BASED_OUTPATIENT_CLINIC_OR_DEPARTMENT_OTHER): Payer: Self-pay

## 2020-11-09 ENCOUNTER — Other Ambulatory Visit (HOSPITAL_BASED_OUTPATIENT_CLINIC_OR_DEPARTMENT_OTHER): Payer: Self-pay

## 2020-11-12 ENCOUNTER — Other Ambulatory Visit (HOSPITAL_BASED_OUTPATIENT_CLINIC_OR_DEPARTMENT_OTHER): Payer: Self-pay

## 2020-11-12 ENCOUNTER — Encounter: Payer: Self-pay | Admitting: Family Medicine

## 2020-11-12 MED ORDER — RIZATRIPTAN BENZOATE 10 MG PO TABS
10.0000 mg | ORAL_TABLET | ORAL | 3 refills | Status: DC | PRN
  Filled 2020-11-12: qty 10, 20d supply, fill #0

## 2020-11-12 NOTE — Telephone Encounter (Signed)
Refilled her Maxalt.  I am not sure exactly which medication she is talking about but we had some fill that prescription for about a year ago.  [facility]  I would recommend highly that she make a follow-up appointment so that we can discuss prophylactic treatment for her migraines.

## 2020-12-03 ENCOUNTER — Other Ambulatory Visit (HOSPITAL_BASED_OUTPATIENT_CLINIC_OR_DEPARTMENT_OTHER): Payer: Self-pay

## 2020-12-03 ENCOUNTER — Other Ambulatory Visit: Payer: Self-pay | Admitting: Family Medicine

## 2020-12-03 DIAGNOSIS — J4541 Moderate persistent asthma with (acute) exacerbation: Secondary | ICD-10-CM

## 2020-12-03 MED ORDER — ALBUTEROL SULFATE HFA 108 (90 BASE) MCG/ACT IN AERS
INHALATION_SPRAY | RESPIRATORY_TRACT | 1 refills | Status: DC
  Filled 2020-12-03: qty 36, 50d supply, fill #0
  Filled 2021-01-26: qty 36, 50d supply, fill #1

## 2020-12-31 ENCOUNTER — Other Ambulatory Visit (HOSPITAL_BASED_OUTPATIENT_CLINIC_OR_DEPARTMENT_OTHER): Payer: Self-pay

## 2020-12-31 ENCOUNTER — Ambulatory Visit: Payer: 59

## 2020-12-31 ENCOUNTER — Telehealth (INDEPENDENT_AMBULATORY_CARE_PROVIDER_SITE_OTHER): Payer: 59 | Admitting: Family Medicine

## 2020-12-31 ENCOUNTER — Encounter: Payer: Self-pay | Admitting: Family Medicine

## 2020-12-31 ENCOUNTER — Other Ambulatory Visit: Payer: Self-pay

## 2020-12-31 DIAGNOSIS — G43109 Migraine with aura, not intractable, without status migrainosus: Secondary | ICD-10-CM | POA: Diagnosis not present

## 2020-12-31 DIAGNOSIS — F411 Generalized anxiety disorder: Secondary | ICD-10-CM | POA: Diagnosis not present

## 2020-12-31 DIAGNOSIS — G479 Sleep disorder, unspecified: Secondary | ICD-10-CM | POA: Diagnosis not present

## 2020-12-31 DIAGNOSIS — F331 Major depressive disorder, recurrent, moderate: Secondary | ICD-10-CM | POA: Diagnosis not present

## 2020-12-31 DIAGNOSIS — Z3042 Encounter for surveillance of injectable contraceptive: Secondary | ICD-10-CM | POA: Diagnosis not present

## 2020-12-31 MED ORDER — MEDROXYPROGESTERONE ACETATE 150 MG/ML IM SUSP
150.0000 mg | Freq: Once | INTRAMUSCULAR | Status: AC
  Administered 2020-12-31: 14:00:00 150 mg via INTRAMUSCULAR

## 2020-12-31 MED ORDER — ESCITALOPRAM OXALATE 10 MG PO TABS
ORAL_TABLET | ORAL | 1 refills | Status: DC
  Filled 2020-12-31: qty 30, 30d supply, fill #0

## 2020-12-31 NOTE — Assessment & Plan Note (Signed)
See note above. PHQ 9 score up to 17, from 5 previsously,  Depression screen [facility] 2/9 12/31/2020 04/20/2020 12/03/2019  Decreased Interest 3 0 1  Down, Depressed, Hopeless 2 0 0  PHQ - 2 Score 5 0 1  Altered sleeping 3 2 1   Tired, decreased energy 3 0 1  Change in appetite 3 1 1   Feeling bad or failure about yourself  1 0 0  Trouble concentrating 0 2 0  Moving slowly or fidgety/restless 2 0 0  Suicidal thoughts 0 0 0  PHQ-9 Score 17 5 4   Difficult doing work/chores Somewhat difficult Not difficult at all Not difficult at all

## 2020-12-31 NOTE — Assessment & Plan Note (Addendum)
Go down to 50mg  for 5 days then 1 tab 5 days. Then stop and start the lexapro. If she develops a rash then stop, take benadryl  and call me back. She really feels like it was unrelated to the medication at that time.  Start half a tab daily for 10 days and then inc to whole tab daily. 

## 2020-12-31 NOTE — Assessment & Plan Note (Signed)
Discussed that we could consider prophylaxis. Reviewed triggers to be on the look out for.  Since she is doing better the last 2 weeks she opted to monitor for a couple of more weeks to see if she is doing better. She does have her rescue med now.  Hopefully her sleep will improve. I suspect his is one of her biggest triggers.

## 2020-12-31 NOTE — Progress Notes (Signed)
Virtual Visit via Video Note  I connected with Shannon Bishop on 12/31/20 at  3:20 PM EDT by a video enabled telemedicine application and verified that I am speaking with the correct person using two identifiers.   I discussed the limitations of evaluation and management by telemedicine and the availability of in person appointments. The patient expressed understanding and agreed to proceed.  Patient location: at home Provider location: in office  Subjective:    CC: feels zoloft is not working well.   HPI: Feels like the zoloft not working as well. Feeling low energy, last month. Anxiety is ramped up too.  Friends want her to go out but she has been declining. She is feeling more restless. She thinks she wants to try something new,she feels too flat. Had tried lexapro before. She was having an itchy rash while on it but she feels like ti was really allergies and not really the medications and would like retry lexapro denies any recent new stressors at home or work.  Feels like she is racing around at times. Co-workers have commented.  She hasn't been sleeping as well for the last month. Hard time falling sleep and staying asleep.   Had 6 migraines in the last month.  Lasted whole day.  Not sleeping as well. Trying to drink more water.  She has been better over the last 2 weeks.   Past medical history, Surgical history, Family history not pertinant except as noted below, Social history, Allergies, and medications have been entered into the medical record, reviewed, and corrections made.    Objective:    General: Speaking clearly in complete sentences without any shortness of breath.  Alert and oriented x3.  Normal judgment. No apparent acute distress.    Impression and Recommendations:    Generalized anxiety disorder Go down to 50mg  for 5 days then 1 tab 5 days. Then stop and start the lexapro. If she develops a rash then stop, take benadryl  and call me back. She really feels like it  was unrelated to the medication at that time.  Start half a tab daily for 10 days and then inc to whole tab daily..    Major depression See note above. PHQ 9 score up to 17, from 5 previsously,  Depression screen [facility] 2/9 12/31/2020 04/20/2020 12/03/2019  Decreased Interest 3 0 1  Down, Depressed, Hopeless 2 0 0  PHQ - 2 Score 5 0 1  Altered sleeping 3 2 1   Tired, decreased energy 3 0 1  Change in appetite 3 1 1   Feeling bad or failure about yourself  1 0 0  Trouble concentrating 0 2 0  Moving slowly or fidgety/restless 2 0 0  Suicidal thoughts 0 0 0  PHQ-9 Score 17 5 4   Difficult doing work/chores Somewhat difficult Not difficult at all Not difficult at all     Migraine with aura and without status migrainosus, not intractable Discussed that we could consider prophylaxis. Reviewed triggers to be on the look out for.  Since she is doing better the last 2 weeks she opted to monitor for a couple of more weeks to see if she is doing better. She does have her rescue med now.  Hopefully her sleep will improve. I suspect his is one of her biggest triggers.    No orders of the defined types were placed in this encounter.   Meds ordered this encounter  Medications   medroxyPROGESTERone (DEPO-PROVERA) injection 150 mg   escitalopram (LEXAPRO)  10 MG tablet    Sig: Take 0.5 tablets (5 mg total) by mouth daily for 10 days, THEN 1 tablet (10 mg total) daily for 20 days.    Dispense:  30 tablet    Refill:  1    I discussed the assessment and treatment plan with the patient. The patient was provided an opportunity to ask questions and all were answered. The patient agreed with the plan and demonstrated an understanding of the instructions.   The patient was advised to call back or seek an in-person evaluation if the symptoms worsen or if the condition fails to improve as anticipated.   Nani Gasser, MD

## 2021-01-26 ENCOUNTER — Other Ambulatory Visit (HOSPITAL_BASED_OUTPATIENT_CLINIC_OR_DEPARTMENT_OTHER): Payer: Self-pay

## 2021-02-11 ENCOUNTER — Ambulatory Visit (INDEPENDENT_AMBULATORY_CARE_PROVIDER_SITE_OTHER): Payer: 59

## 2021-02-11 ENCOUNTER — Encounter: Payer: Self-pay | Admitting: Medical-Surgical

## 2021-02-11 ENCOUNTER — Other Ambulatory Visit (HOSPITAL_BASED_OUTPATIENT_CLINIC_OR_DEPARTMENT_OTHER): Payer: Self-pay

## 2021-02-11 ENCOUNTER — Other Ambulatory Visit: Payer: Self-pay

## 2021-02-11 ENCOUNTER — Ambulatory Visit (INDEPENDENT_AMBULATORY_CARE_PROVIDER_SITE_OTHER): Payer: 59 | Admitting: Medical-Surgical

## 2021-02-11 VITALS — BP 112/72 | HR 78 | Resp 20 | Wt 149.0 lb

## 2021-02-11 DIAGNOSIS — M25562 Pain in left knee: Secondary | ICD-10-CM

## 2021-02-11 DIAGNOSIS — M2142 Flat foot [pes planus] (acquired), left foot: Secondary | ICD-10-CM

## 2021-02-11 DIAGNOSIS — M2141 Flat foot [pes planus] (acquired), right foot: Secondary | ICD-10-CM

## 2021-02-11 MED ORDER — PREDNISONE 50 MG PO TABS
50.0000 mg | ORAL_TABLET | Freq: Every day | ORAL | 0 refills | Status: DC
  Filled 2021-02-11: qty 5, 5d supply, fill #0

## 2021-02-11 MED ORDER — MELOXICAM 15 MG PO TABS
ORAL_TABLET | ORAL | 3 refills | Status: AC
  Filled 2021-02-11: qty 30, 30d supply, fill #0

## 2021-02-11 NOTE — Patient Instructions (Signed)
Acute Knee Pain, Adult Acute knee pain is sudden and may be caused by damage, swelling, or irritation of the muscles and tissues that support the knee. Pain may result from: A fall. An injury to the knee from twisting motions. A hit to the knee. Infection. Acute knee pain may go away on its own with time and rest. If it does not, your health care provider may order tests to find the cause of the pain. These may include: Imaging tests, such as an X-ray, MRI, CT scan, or ultrasound. Joint aspiration. In this test, fluid is removed from the knee and evaluated. Arthroscopy. In this test, a lighted tube is inserted into the knee and an image is projected onto a TV screen. Biopsy. In this test, a sample of tissue is removed from the body and studied under a microscope. Follow these instructions at home: If you have a knee sleeve or brace:  Wear the knee sleeve or brace as told by your health care provider. Remove it only as told by your health care provider. Loosen it if your toes tingle, become numb, or turn cold and blue. Keep it clean. If the knee sleeve or brace is not waterproof: Do not let it get wet. Cover it with a watertight covering when you take a bath or shower.  Activity Rest your knee. Do not do things that cause pain or make pain worse. Avoid high-impact activities or exercises, such as running, jumping rope, or doing jumping jacks. Work with a physical therapist to make a safe exercise program, as recommended by your health care provider. Do exercises as told by your physical therapist. Managing pain, stiffness, and swelling  If directed, put ice on the affected knee. To do this: If you have a removable knee sleeve or brace, remove it as told by your health care provider. Put ice in a plastic bag. Place a towel between your skin and the bag. Leave the ice on for 20 minutes, 2-3 times a day. Remove the ice if your skin turns bright red. This is very important. If you cannot  feel pain, heat, or cold, you have a greater risk of damage to the area. If directed, use an elastic bandage to put pressure (compression) on your injured knee. This may control swelling, give support, and help with discomfort. Raise (elevate) your knee above the level of your heart while you are sitting or lying down. Sleep with a pillow under your knee.  General instructions Take over-the-counter and prescription medicines only as told by your health care provider. Do not use any products that contain nicotine or tobacco, such as cigarettes, e-cigarettes, and chewing tobacco. If you need help quitting, ask your health care provider. If you are overweight, work with your health care provider and a dietitian to set a weight-loss goal that is healthy and reasonable for you. Extra weight can put pressure on your knee. Pay attention to any changes in your symptoms. Keep all follow-up visits. This is important. Contact a health care provider if: Your knee pain continues, changes, or gets worse. You have a fever along with knee pain. Your knee feels warm to the touch or is red. Your knee buckles or locks up. Get help right away if: Your knee swells, and the swelling becomes worse. You cannot move your knee. You have severe pain in your knee that cannot be managed with pain medicine. Summary Acute knee pain can be caused by a fall, an injury, an infection, or damage, swelling,   or irritation of the tissues that support your knee. Your health care provider may perform tests to find out the cause of the pain. Pay attention to any changes in your symptoms. Relieve your pain with rest, medicines, light activity, and the use of ice. Get help right away if your knee swells, you cannot move your knee, or you have severe pain that cannot be managed with medicine. This information is not intended to replace advice given to you by your health care provider. Make sure you discuss any questions you have with  your healthcare provider. Document Revised: 12/04/2019 Document Reviewed: 12/04/2019 Elsevier Patient Education  2022 [employer]  

## 2021-02-11 NOTE — Progress Notes (Signed)
  HPI with pertinent ROS:   CC: left knee pain  HPI: Is a pleasant 20 year old female presenting today with complaints of left knee pain that has been intermittent since May.  Recently has gotten pretty bad and her normal treatments are not working.  She works at the [employer] and is on her feet for long hours every day.  She previously used menthol patches that she got over-the-counter which were very helpful.  Unfortunately these have stopped working.  She does have meloxicam 15 mg at home home and she took a dose yesterday which did provide some relief.  Notes that when she walks her knee pops a lot.  The pain is intermittent and very sharp.  She has not found any particular activity or movement that makes it worse but notes that there is absolutely no pain when she bends her knee fully.  She has tenderness to the lateral kneecap extending along the lateral side of the thigh.  No previous injuries or surgeries to the area in question  I reviewed the past medical history, family history, social history, surgical history, and allergies today and no changes were needed.  Please see the problem list section below in epic for further details.   Physical exam:   General: Well Developed, well nourished, and in no acute distress.  Neuro: Alert and oriented x3.  HEENT: Normocephalic, atraumatic.  Skin: Warm and dry. Cardiac: Regular rate and rhythm, no murmurs rubs or gallops, no lower extremity edema.  Respiratory: Clear to auscultation bilaterally. Not using accessory muscles, speaking in full sentences. Left knee: Full range of motion with flexion is in extension.  No pain with varus stress but mild discomfort with valgus stress.  No crepitus or locking.  Some edema along the lateral and inferior patella but no noticeable effusion.  Impression and Recommendations:    1. Acute pain of left knee She does have some inflammation and edema to the knee so we will do a 5-day course of  prednisone.  Getting x-rays today.  After she completes prednisone, switch to meloxicam daily for the next 2 weeks.  Suspect a lot of her discomfort comes from the fact that she has flatfeet and is not using any orthotics.  She may also have a bit of IT band syndrome complicating her symptoms.  IT band syndrome exercises printed and provided with AVS.  Recommend conservative treatment with Tylenol, heat, ice, and topical agents. - DG Knee Complete 4 Views Left; Future  2. Pes planus of both feet Referring to sports medicine for evaluation of pes planus.  Feel that she would do well with orthotics since she is on her feet for long hours. - Ambulatory referral to Sports Medicine  Return if symptoms worsen or fail to improve. ___________________________________________ Thayer Ohm, DNP, APRN, FNP-BC Primary Care and Sports Medicine [facility] [place]

## 2021-02-18 ENCOUNTER — Emergency Department: Admission: EM | Admit: 2021-02-18 | Discharge: 2021-02-18 | Discharge status: Against Medical Advice | Payer: Self-pay

## 2021-03-10 ENCOUNTER — Other Ambulatory Visit: Payer: Self-pay | Admitting: Family Medicine

## 2021-03-10 ENCOUNTER — Other Ambulatory Visit (HOSPITAL_BASED_OUTPATIENT_CLINIC_OR_DEPARTMENT_OTHER): Payer: Self-pay

## 2021-03-10 DIAGNOSIS — J4541 Moderate persistent asthma with (acute) exacerbation: Secondary | ICD-10-CM

## 2021-03-10 MED ORDER — ALBUTEROL SULFATE HFA 108 (90 BASE) MCG/ACT IN AERS
INHALATION_SPRAY | RESPIRATORY_TRACT | 1 refills | Status: DC
  Filled 2021-03-10: qty 36, 50d supply, fill #0
  Filled 2021-04-23: qty 36, 50d supply, fill #1

## 2021-03-18 ENCOUNTER — Other Ambulatory Visit: Payer: Self-pay

## 2021-03-18 ENCOUNTER — Ambulatory Visit (INDEPENDENT_AMBULATORY_CARE_PROVIDER_SITE_OTHER): Payer: 59 | Admitting: Family Medicine

## 2021-03-18 VITALS — BP 106/52 | HR 96 | Wt 149.0 lb

## 2021-03-18 DIAGNOSIS — Z3042 Encounter for surveillance of injectable contraceptive: Secondary | ICD-10-CM

## 2021-03-18 DIAGNOSIS — Z23 Encounter for immunization: Secondary | ICD-10-CM | POA: Diagnosis not present

## 2021-03-18 LAB — POCT URINE PREGNANCY: Preg Test, Ur: NEGATIVE

## 2021-03-18 MED ORDER — MEDROXYPROGESTERONE ACETATE 150 MG/ML IM SUSP
150.0000 mg | Freq: Once | INTRAMUSCULAR | Status: AC
  Administered 2021-03-18: 150 mg via INTRAMUSCULAR

## 2021-03-18 NOTE — Progress Notes (Signed)
HPI:  Patient is here for a Depo Provera injection.  Pt denies chest pain, shortness of breath, headaches, mood changes, or problems with medication.   A&P:  Injection administered in left upper outer quadrant.  Pt tolerated injection well without complications Pt advised to schedule next injection in 12 weeks.       

## 2021-03-18 NOTE — Progress Notes (Signed)
Agree with documentation as above.   Doyal Saric, MD  

## 2021-04-23 ENCOUNTER — Other Ambulatory Visit (HOSPITAL_BASED_OUTPATIENT_CLINIC_OR_DEPARTMENT_OTHER): Payer: Self-pay

## 2021-05-18 ENCOUNTER — Other Ambulatory Visit: Payer: Self-pay | Admitting: Family Medicine

## 2021-05-18 DIAGNOSIS — J4541 Moderate persistent asthma with (acute) exacerbation: Secondary | ICD-10-CM

## 2021-05-19 ENCOUNTER — Other Ambulatory Visit (HOSPITAL_BASED_OUTPATIENT_CLINIC_OR_DEPARTMENT_OTHER): Payer: Self-pay

## 2021-05-19 MED ORDER — ALBUTEROL SULFATE HFA 108 (90 BASE) MCG/ACT IN AERS
INHALATION_SPRAY | RESPIRATORY_TRACT | 0 refills | Status: DC
  Filled 2021-05-19: qty 36, fill #0
  Filled 2021-05-25 (×2): qty 36, 50d supply, fill #0

## 2021-05-25 ENCOUNTER — Other Ambulatory Visit (HOSPITAL_BASED_OUTPATIENT_CLINIC_OR_DEPARTMENT_OTHER): Payer: Self-pay

## 2021-06-03 ENCOUNTER — Ambulatory Visit: Payer: 59

## 2021-06-23 ENCOUNTER — Ambulatory Visit (INDEPENDENT_AMBULATORY_CARE_PROVIDER_SITE_OTHER): Payer: 59 | Admitting: Family Medicine

## 2021-06-23 ENCOUNTER — Other Ambulatory Visit: Payer: Self-pay

## 2021-06-23 DIAGNOSIS — Z3042 Encounter for surveillance of injectable contraceptive: Secondary | ICD-10-CM

## 2021-06-23 MED ORDER — MEDROXYPROGESTERONE ACETATE 150 MG/ML IM SUSP
150.0000 mg | Freq: Once | INTRAMUSCULAR | Status: AC
Start: 2021-06-23 — End: 2021-06-23
  Administered 2021-06-23: 10:00:00 150 mg via INTRAMUSCULAR

## 2021-06-23 NOTE — Progress Notes (Signed)
HPI:  Patient is here for a Depo Provera injection.  Pt denies chest pain, shortness of breath, headaches, mood changes, or problems with medication.   A&P:  Injection administered in right upper outer quadrant.  Pt tolerated injection well without complications Pt advised to schedule next injection in 12 weeks.      Tiajuana Amass, CMA

## 2021-06-23 NOTE — Progress Notes (Signed)
Agree with documentation as above.   Yonah Tangeman, MD  

## 2021-06-24 ENCOUNTER — Other Ambulatory Visit (HOSPITAL_BASED_OUTPATIENT_CLINIC_OR_DEPARTMENT_OTHER): Payer: Self-pay

## 2021-07-09 ENCOUNTER — Other Ambulatory Visit: Payer: Self-pay | Admitting: Family Medicine

## 2021-07-09 ENCOUNTER — Other Ambulatory Visit (HOSPITAL_BASED_OUTPATIENT_CLINIC_OR_DEPARTMENT_OTHER): Payer: Self-pay

## 2021-07-09 DIAGNOSIS — J4541 Moderate persistent asthma with (acute) exacerbation: Secondary | ICD-10-CM

## 2021-07-09 MED ORDER — ALBUTEROL SULFATE (2.5 MG/3ML) 0.083% IN NEBU: 2.5000 mg | INHALATION_SOLUTION | Freq: Four times a day (QID) | RESPIRATORY_TRACT | 0 refills | Status: DC | PRN

## 2021-07-09 MED ORDER — ALBUTEROL SULFATE (2.5 MG/3ML) 0.083% IN NEBU
INHALATION_SOLUTION | RESPIRATORY_TRACT | 0 refills | Status: DC
  Filled 2021-07-09: qty 75, 6d supply, fill #0

## 2021-07-12 ENCOUNTER — Other Ambulatory Visit: Payer: Self-pay | Admitting: Family Medicine

## 2021-07-12 ENCOUNTER — Other Ambulatory Visit (HOSPITAL_BASED_OUTPATIENT_CLINIC_OR_DEPARTMENT_OTHER): Payer: Self-pay

## 2021-07-12 DIAGNOSIS — J4541 Moderate persistent asthma with (acute) exacerbation: Secondary | ICD-10-CM

## 2021-07-12 MED ORDER — ALBUTEROL SULFATE HFA 108 (90 BASE) MCG/ACT IN AERS
INHALATION_SPRAY | RESPIRATORY_TRACT | 1 refills | Status: DC
  Filled 2021-07-12: qty 36, fill #0

## 2021-07-14 ENCOUNTER — Other Ambulatory Visit: Payer: Self-pay

## 2021-07-14 ENCOUNTER — Ambulatory Visit: Payer: 59 | Admitting: Physician Assistant

## 2021-07-14 ENCOUNTER — Other Ambulatory Visit (HOSPITAL_BASED_OUTPATIENT_CLINIC_OR_DEPARTMENT_OTHER): Payer: Self-pay

## 2021-07-14 ENCOUNTER — Encounter: Payer: Self-pay | Admitting: Physician Assistant

## 2021-07-14 VITALS — BP 123/71 | HR 90 | Ht 62.0 in | Wt 148.0 lb

## 2021-07-14 DIAGNOSIS — J4541 Moderate persistent asthma with (acute) exacerbation: Secondary | ICD-10-CM

## 2021-07-14 DIAGNOSIS — F331 Major depressive disorder, recurrent, moderate: Secondary | ICD-10-CM

## 2021-07-14 DIAGNOSIS — J209 Acute bronchitis, unspecified: Secondary | ICD-10-CM | POA: Diagnosis not present

## 2021-07-14 DIAGNOSIS — F411 Generalized anxiety disorder: Secondary | ICD-10-CM | POA: Diagnosis not present

## 2021-07-14 MED ORDER — ESCITALOPRAM OXALATE 20 MG PO TABS
ORAL_TABLET | ORAL | 0 refills | Status: DC
  Filled 2021-07-14: qty 90, 90d supply, fill #0

## 2021-07-14 MED ORDER — BENZONATATE 200 MG PO CAPS
200.0000 mg | ORAL_CAPSULE | Freq: Three times a day (TID) | ORAL | 0 refills | Status: DC | PRN
  Filled 2021-07-14: qty 30, 10d supply, fill #0

## 2021-07-14 MED ORDER — FLUTICASONE PROPIONATE 50 MCG/ACT NA SUSP
2.0000 | Freq: Every day | NASAL | 0 refills | Status: DC
  Filled 2021-07-14: qty 16, 30d supply, fill #0

## 2021-07-14 MED ORDER — ALBUTEROL SULFATE HFA 108 (90 BASE) MCG/ACT IN AERS
INHALATION_SPRAY | RESPIRATORY_TRACT | 1 refills | Status: DC
  Filled 2021-07-14: qty 36, 50d supply, fill #0
  Filled 2021-08-20 – 2021-08-23 (×2): qty 36, 50d supply, fill #1

## 2021-07-14 MED ORDER — PREDNISONE 50 MG PO TABS
50.0000 mg | ORAL_TABLET | Freq: Every day | ORAL | 0 refills | Status: DC
Start: 2021-07-14 — End: 2021-09-09
  Filled 2021-07-14: qty 5, 5d supply, fill #0

## 2021-07-14 NOTE — Progress Notes (Signed)
Subjective:    Patient ID: Shannon Bishop, female    DOB: [birth year], 21 y.o.   MRN: [id number]  HPI Pt is a 21 yo female with hx of asthma and recent covid who presents to the clinic with cough and chest tightness for the last 2-3 days.  Using albuterol and needs refills. It does help a lot. No fever, chills, body aches. Not blowing out anything and no headache. Her cough is productive. She did feel better after covid infection 2 weeks ago.   .. Active Ambulatory Problems    Diagnosis Date Noted   Chronic seasonal allergic rhinitis 03/15/2010   ADHD (attention deficit hyperactivity disorder) 06/29/2012   Major depression 11/26/2015   Generalized anxiety disorder 01/07/2016   Menorrhagia with regular cycle 05/10/2017   Skin hypopigmentation 05/10/2017   Asthma in adult, moderate persistent, uncomplicated 12/04/2018   Migraine with aura and without status migrainosus, not intractable 11/12/2019   Ganglion cyst of flexor tendon sheath of finger of right hand 04/09/2020   Resolved Ambulatory Problems    Diagnosis Date Noted   ASTHMA 03/15/2010   PNEUMONIA, RIGHT 08/25/2010   ADVERSE DRUG REACTION 08/25/2010   Sprain, lateral patellar retinaculum of the left knee. 04/04/2012   Asthma exacerbation 10/06/2013   Right knee pain 06/09/2015   Laceration, eyelid, left 07/27/2015   Major depressive disorder, recurrent episode, moderate (HCC) 01/07/2016   Viral upper respiratory infection 04/06/2016   Rhomboid muscle pain 06/15/2016   Abdominal pain 11/21/2016   COVID-19 09/12/2019   Past Medical History:  Diagnosis Date   Anxiety    Asthma    Depression    Environmental allergies       Review of Systems See HPI.     Objective:   Physical Exam Vitals reviewed.  Constitutional:      Appearance: Normal appearance.  HENT:     Head: Normocephalic.     Right Ear: Tympanic membrane, ear canal and external ear normal. There is no impacted cerumen.     Left Ear: Tympanic  membrane, ear canal and external ear normal. There is no impacted cerumen.     Nose: Congestion present.     Mouth/Throat:     Mouth: Mucous membranes are moist.     Pharynx: No posterior oropharyngeal erythema.  Eyes:     Conjunctiva/sclera: Conjunctivae normal.  Neck:     Vascular: No carotid bruit.  Cardiovascular:     Rate and Rhythm: Normal rate and regular rhythm.     Pulses: Normal pulses.  Pulmonary:     Effort: Pulmonary effort is normal.     Breath sounds: Normal breath sounds. No wheezing or rhonchi.  Musculoskeletal:     Cervical back: No tenderness.  Lymphadenopathy:     Cervical: No cervical adenopathy.  Neurological:     General: No focal deficit present.     Mental Status: She is alert and oriented to person, place, and time.  Psychiatric:        Mood and Affect: Mood normal.      .. Depression screen [facility] 2/9 07/14/2021 12/31/2020 04/20/2020 12/03/2019 11/12/2019  Decreased Interest 2 3 0 1 2  Down, Depressed, Hopeless 1 2 0 0 2  PHQ - 2 Score 3 5 0 1 4  Altered sleeping 3 3 2 1 3   Tired, decreased energy 1 3 0 1 2  Change in appetite 2 3 1 1 3   Feeling bad or failure about yourself  0 1 0 0 1  Trouble concentrating 2 0 2 0 3  Moving slowly or fidgety/restless 0 2 0 0 0  Suicidal thoughts 0 0 0 0 0  PHQ-9 Score 11 17 5 4 16   Difficult doing work/chores Not difficult at all Somewhat difficult Not difficult at all Not difficult at all Somewhat difficult   . GAD 7 : Generalized Anxiety Score 07/14/2021 12/31/2020 04/20/2020 12/03/2019  Nervous, Anxious, on Edge 2 3 1 2   Control/stop worrying 2 3 0 1  Worry too much - different things 2 3 0 1  Trouble relaxing 0 3 1 1   Restless 0 2 0 0  Easily annoyed or irritable 2 3 0 0  Afraid - awful might happen 0 0 0 0  Total GAD 7 Score 8 17 2 5   Anxiety Difficulty Not difficult at all Somewhat difficult Not difficult at all Not difficult at all        Assessment & Plan:  02/02/2020 Carsyn was seen today for  cough.  Diagnoses and all orders for this visit:  Acute bronchitis, unspecified organism -     predniSONE (DELTASONE) 50 MG tablet; Take 1 tablet (50 mg total) by mouth daily for 5 days. -     benzonatate (TESSALON) 200 MG capsule; Take 1 capsule (200 mg total) by mouth 3 (three) times daily as needed. -     fluticasone (FLONASE) 50 MCG/ACT nasal spray; Place 2 sprays into both nostrils daily.  Moderate persistent asthma with acute exacerbation -     albuterol (VENTOLIN HFA) 108 (90 Base) MCG/ACT inhaler; INHALE 2 PUFFS BY MOUTH INTO THE LUNGS EVERY 6 (SIX) HOURS AS NEEDED FOR WHEEZING OR SHORTNESS OF BREATH. -     fluticasone (FLONASE) 50 MCG/ACT nasal spray; Place 2 sprays into both nostrils daily.  Generalized anxiety disorder -     escitalopram (LEXAPRO) 20 MG tablet; One half tab daily for a week then one tab by mouth daily  Moderate episode of recurrent major depressive disorder (HCC) -     escitalopram (LEXAPRO) 20 MG tablet; One half tab daily for a week then one tab by mouth daily   Pt just started lexapro and had a great response but still not to goal. Increase to 20mg  daily and follow up with PCP in next 1-2 months. Sent lexapro to pharmacy. PHQ and GAD numbers did have a great improvement.   No signs of bacterial infection. Sent prednisone, tessalon, albuterol. Discussed warning signs of bacterial infection to call back. Rest and hydrate.

## 2021-07-14 NOTE — Patient Instructions (Signed)
Acute Bronchitis, Adult °Acute bronchitis is sudden inflammation of the main airways (bronchi) that come off the windpipe (trachea) in the lungs. The swelling causes the airways to get smaller and make more mucus than normal. This can make it hard to breathe and can cause coughing or noisy breathing (wheezing). °Acute bronchitis may last several weeks. The cough may last longer. Allergies, asthma, and exposure to smoke may make the condition worse. °What are the causes? °This condition can be caused by germs and by substances that irritate the lungs, including: °Cold and flu viruses. The most common cause of this condition is the virus that causes the common cold. °Bacteria. This is less common. °Breathing in substances that irritate the lungs, including: °Smoke from cigarettes and other forms of tobacco. °Dust and pollen. °Fumes from household cleaning products, gases, or burned fuel. °Indoor or outdoor air pollution. °What increases the risk? °The following factors may make you more likely to develop this condition: °A weak body's defense system, also called the immune system. °A condition that affects your lungs and breathing, such as asthma. °What are the signs or symptoms? °Common symptoms of this condition include: °Coughing. This may bring up clear, yellow, or green mucus from your lungs (sputum). °Wheezing. °Runny or stuffy nose. °Having too much mucus in your lungs (chest congestion). °Shortness of breath. °Aches and pains, including sore throat or chest. °How is this diagnosed? °This condition is usually diagnosed based on: °Your symptoms and medical history. °A physical exam. °You may also have other tests, including tests to rule out other conditions, such as pneumonia. These tests include: °A test of lung function. °Test of a mucus sample to look for the presence of bacteria. °Tests to check the oxygen level in your blood. °Blood tests. °Chest X-ray. °How is this treated? °Most cases of acute bronchitis  clear up over time without treatment. Your health care provider may recommend: °Drinking more fluids to help thin your mucus so it is easier to cough up. °Taking inhaled medicine (inhaler) to improve air flow in and out of your lungs. °Using a vaporizer or a humidifier. These are machines that add water to the air to help you breathe better. °Taking a medicine that thins mucus and clears congestion (expectorant). °Taking a medicine that prevents or stops coughing (cough suppressant). °It is notcommon to take an antibiotic medicine for this condition. °Follow these instructions at home: ° °Take over-the-counter and prescription medicines only as told by your health care provider. °Use an inhaler, vaporizer, or humidifier as told by your health care provider. °Take two teaspoons (10 mL) of honey at bedtime to lessen coughing at night. °Drink enough fluid to keep your urine pale yellow. °Do not use any products that contain nicotine or tobacco. These products include cigarettes, chewing tobacco, and vaping devices, such as e-cigarettes. If you need help quitting, ask your health care provider. °Get plenty of rest. °Return to your normal activities as told by your health care provider. Ask your health care provider what activities are safe for you. °Keep all follow-up visits. This is important. °How is this prevented? °To lower your risk of getting this condition again: °Wash your hands often with soap and water for at least 20 seconds. If soap and water are not available, use hand sanitizer. °Avoid contact with people who have cold symptoms. °Try not to touch your mouth, nose, or eyes with your hands. °Avoid breathing in smoke or chemical fumes. Breathing smoke or chemical fumes will make your condition   worse. °Get the flu shot every year. °Contact a health care provider if: °Your symptoms do not improve after 2 weeks. °You have trouble coughing up the mucus. °Your cough keeps you awake at night. °You have a  fever. °Get help right away if you: °Cough up blood. °Feel pain in your chest. °Have severe shortness of breath. °Faint or keep feeling like you are going to faint. °Have a severe headache. °Have a fever or chills that get worse. °These symptoms may represent a serious problem that is an emergency. Do not wait to see if the symptoms will go away. Get medical help right away. Call your local emergency services (911 in the [place] Do not drive yourself to the hospital. °Summary °Acute bronchitis is inflammation of the main airways (bronchi) that come off the windpipe (trachea) in the lungs. The swelling causes the airways to get smaller and make more mucus than normal. °Drinking more fluids can help thin your mucus so it is easier to cough up. °Take over-the-counter and prescription medicines only as told by your health care provider. °Do not use any products that contain nicotine or tobacco. These products include cigarettes, chewing tobacco, and vaping devices, such as e-cigarettes. If you need help quitting, ask your health care provider. °Contact a health care provider if your symptoms do not improve after 2 weeks. °This information is not intended to replace advice given to you by your health care provider. Make sure you discuss any questions you have with your health care provider. °Document Revised: 10/21/2020 Document Reviewed: 10/21/2020 °Elsevier Patient Education © 2022 [employer] ° °

## 2021-07-16 ENCOUNTER — Encounter: Payer: Self-pay | Admitting: Physician Assistant

## 2021-07-29 ENCOUNTER — Other Ambulatory Visit (HOSPITAL_BASED_OUTPATIENT_CLINIC_OR_DEPARTMENT_OTHER): Payer: Self-pay

## 2021-08-20 ENCOUNTER — Other Ambulatory Visit (HOSPITAL_BASED_OUTPATIENT_CLINIC_OR_DEPARTMENT_OTHER): Payer: Self-pay

## 2021-08-23 ENCOUNTER — Other Ambulatory Visit (HOSPITAL_BASED_OUTPATIENT_CLINIC_OR_DEPARTMENT_OTHER): Payer: Self-pay

## 2021-09-09 ENCOUNTER — Ambulatory Visit (INDEPENDENT_AMBULATORY_CARE_PROVIDER_SITE_OTHER): Payer: 59 | Admitting: Family Medicine

## 2021-09-09 ENCOUNTER — Other Ambulatory Visit: Payer: Self-pay

## 2021-09-09 VITALS — BP 116/64 | HR 84

## 2021-09-09 DIAGNOSIS — Z3042 Encounter for surveillance of injectable contraceptive: Secondary | ICD-10-CM | POA: Diagnosis not present

## 2021-09-09 MED ORDER — MEDROXYPROGESTERONE ACETATE 150 MG/ML IM SUSP
150.0000 mg | Freq: Once | INTRAMUSCULAR | Status: AC
  Administered 2021-09-09: 09:00:00 150 mg via INTRAMUSCULAR

## 2021-09-09 NOTE — Progress Notes (Signed)
Agree with documentation as above.   Orbin Mayeux, MD  

## 2021-09-09 NOTE — Progress Notes (Signed)
Patient is here for Depo Provera injection. Denies chest pain, shortness of breath, headaches, mood changes, or problems with medication.  ? ?Injection administered in the [facility]. Patient tolerated injection well without complications. Patient advised to schedule next injection in 12 weeks.  ? ? ?

## 2021-10-04 ENCOUNTER — Other Ambulatory Visit: Payer: Self-pay | Admitting: Physician Assistant

## 2021-10-04 ENCOUNTER — Other Ambulatory Visit (HOSPITAL_BASED_OUTPATIENT_CLINIC_OR_DEPARTMENT_OTHER): Payer: Self-pay

## 2021-10-04 DIAGNOSIS — J4541 Moderate persistent asthma with (acute) exacerbation: Secondary | ICD-10-CM

## 2021-10-04 MED ORDER — ALBUTEROL SULFATE HFA 108 (90 BASE) MCG/ACT IN AERS
INHALATION_SPRAY | RESPIRATORY_TRACT | 1 refills | Status: DC
  Filled 2021-10-04: qty 36, 50d supply, fill #0
  Filled 2021-10-08: qty 36, 30d supply, fill #0
  Filled 2021-11-12: qty 13.4, 50d supply, fill #1
  Filled 2021-12-14 – 2021-12-29 (×2): qty 13.4, 50d supply, fill #2
  Filled 2022-02-04: qty 6.7, 25d supply, fill #3

## 2021-10-08 ENCOUNTER — Other Ambulatory Visit (HOSPITAL_BASED_OUTPATIENT_CLINIC_OR_DEPARTMENT_OTHER): Payer: Self-pay

## 2021-10-26 ENCOUNTER — Other Ambulatory Visit: Payer: Self-pay | Admitting: Physician Assistant

## 2021-10-26 DIAGNOSIS — F411 Generalized anxiety disorder: Secondary | ICD-10-CM

## 2021-10-26 DIAGNOSIS — F331 Major depressive disorder, recurrent, moderate: Secondary | ICD-10-CM

## 2021-10-27 ENCOUNTER — Other Ambulatory Visit (HOSPITAL_BASED_OUTPATIENT_CLINIC_OR_DEPARTMENT_OTHER): Payer: Self-pay

## 2021-10-27 MED ORDER — ESCITALOPRAM OXALATE 20 MG PO TABS
ORAL_TABLET | ORAL | 0 refills | Status: DC
  Filled 2021-10-27: qty 90, 90d supply, fill #0

## 2021-11-12 ENCOUNTER — Other Ambulatory Visit (HOSPITAL_BASED_OUTPATIENT_CLINIC_OR_DEPARTMENT_OTHER): Payer: Self-pay

## 2021-11-26 ENCOUNTER — Ambulatory Visit: Payer: 59

## 2021-12-14 ENCOUNTER — Other Ambulatory Visit: Payer: Self-pay | Admitting: Family Medicine

## 2021-12-14 ENCOUNTER — Other Ambulatory Visit (HOSPITAL_BASED_OUTPATIENT_CLINIC_OR_DEPARTMENT_OTHER): Payer: Self-pay

## 2021-12-14 DIAGNOSIS — J4541 Moderate persistent asthma with (acute) exacerbation: Secondary | ICD-10-CM

## 2021-12-14 MED ORDER — ALBUTEROL SULFATE (2.5 MG/3ML) 0.083% IN NEBU: 2.5000 mg | INHALATION_SOLUTION | Freq: Four times a day (QID) | RESPIRATORY_TRACT | 0 refills | Status: DC | PRN

## 2021-12-29 ENCOUNTER — Other Ambulatory Visit (HOSPITAL_BASED_OUTPATIENT_CLINIC_OR_DEPARTMENT_OTHER): Payer: Self-pay

## 2022-01-11 ENCOUNTER — Other Ambulatory Visit (HOSPITAL_BASED_OUTPATIENT_CLINIC_OR_DEPARTMENT_OTHER): Payer: Self-pay

## 2022-01-11 ENCOUNTER — Encounter: Payer: Self-pay | Admitting: Family Medicine

## 2022-01-11 ENCOUNTER — Ambulatory Visit: Payer: 59 | Admitting: Family Medicine

## 2022-01-11 VITALS — BP 77/50 | HR 69 | Wt 150.0 lb

## 2022-01-11 DIAGNOSIS — J4541 Moderate persistent asthma with (acute) exacerbation: Secondary | ICD-10-CM

## 2022-01-11 DIAGNOSIS — J22 Unspecified acute lower respiratory infection: Secondary | ICD-10-CM | POA: Diagnosis not present

## 2022-01-11 MED ORDER — PREDNISONE 20 MG PO TABS
40.0000 mg | ORAL_TABLET | Freq: Every day | ORAL | 0 refills | Status: DC
  Filled 2022-01-11: qty 10, 5d supply, fill #0

## 2022-01-11 MED ORDER — AZITHROMYCIN 250 MG PO TABS
ORAL_TABLET | ORAL | 0 refills | Status: AC
  Filled 2022-01-11: qty 6, 5d supply, fill #0

## 2022-01-11 NOTE — Progress Notes (Signed)
Acute Office Visit  Subjective:     Patient ID: Shannon Bishop, female    DOB: 05/13/2001, 21 y.o.   MRN: [id number]  Chief Complaint  Patient presents with   Cough    HPI Patient is in today for Cough x8 days.  She says its been productive.  She has not had any sore throat nasal congestion or ear pain.  She feels like she is actually getting worse in fact she woke up in the middle of the night last night and vomited.  Today she feels like there is more pressure and heaviness in her chest.  She has noted some wheezing.  ROS      Objective:    BP (!) 77/50   Pulse 69   Wt 150 lb (68 kg)   SpO2 98%   BMI 27.44 kg/m    Physical Exam Constitutional:      Appearance: She is well-developed.  HENT:     Head: Normocephalic and atraumatic.     Right Ear: Tympanic membrane, ear canal and external ear normal.     Left Ear: Tympanic membrane, ear canal and external ear normal.     Nose: Nose normal.  Eyes:     Conjunctiva/sclera: Conjunctivae normal.     Pupils: Pupils are equal, round, and reactive to light.  Neck:     Thyroid: No thyromegaly.  Cardiovascular:     Rate and Rhythm: Normal rate and regular rhythm.     Heart sounds: Normal heart sounds.  Pulmonary:     Effort: Pulmonary effort is normal.     Breath sounds: No wheezing.     Comments: [place] wheezing at the right lung base.  And decreased breath sounds at the mid left lung area.  No crackles or rhonchi. Musculoskeletal:     Cervical back: Neck supple.  Lymphadenopathy:     Cervical: No cervical adenopathy.  Skin:    General: Skin is warm and dry.  Neurological:     Mental Status: She is alert and oriented to person, place, and time.     No results found for any visits on 01/11/22.      Assessment & Plan:   Problem List Items Addressed This Visit   None Visit Diagnoses     Lower respiratory infection    -  Primary   Relevant Medications   azithromycin (ZITHROMAX) 250 MG tablet   Moderate  persistent asthma with exacerbation       Relevant Medications   predniSONE (DELTASONE) 20 MG tablet      Lower respiratory tract infection getting worse/suspect early pneumonia-we will go ahead and start azithromycin and treat with prednisone as I do think there is some underlying component of asthma exacerbation.  Encouraged her to liberalize her albuterol as well.  Call if not improving in the next couple days or if developing new or worsening symptoms.  Consider chest x-ray before the weekend if not improving  Really push fluids and hydrate today and drink well. Okay to use ibuprofen or Tylenol for fever or pain relief. If you feel like you are not getting better in the next couple days on the antibiotic please let us know.  Or if you feel like you are getting more short of breath or more chest discomfort then please let us know. Okay to use your albuterol every 4 hours as needed if you are feeling tight, short of breath or noticing wheezing.  Low blood pressure-likely secondary to mild dehydration.  Really encouraged her to push fluids today she is only vomited once so should be able to keep fluids down.  Meds ordered this encounter  Medications   azithromycin (ZITHROMAX) 250 MG tablet    Sig: 2 Ttabs PO on Day 1, then one a day x 4 days.    Dispense:  6 tablet    Refill:  0   predniSONE (DELTASONE) 20 MG tablet    Sig: Take 2 tablets (40 mg total) by mouth daily with breakfast.    Dispense:  10 tablet    Refill:  0    Return if symptoms worsen or fail to improve.  Nani Gasser, MD

## 2022-01-11 NOTE — Patient Instructions (Addendum)
Really push fluids and hydrate today and drink well. Okay to use ibuprofen or Tylenol for fever or pain relief. If you feel like you are not getting better in the next couple days on the antibiotic please let us know.  Or if you feel like you are getting more short of breath or more chest discomfort then please let us know. Okay to use your albuterol every 4 hours as needed if you are feeling tight, short of breath or noticing wheezing.

## 2022-01-23 ENCOUNTER — Encounter: Payer: Self-pay | Admitting: Family Medicine

## 2022-01-24 NOTE — Telephone Encounter (Signed)
She is on max dose. Recommend appt to discuss options.ok for virtual

## 2022-02-04 ENCOUNTER — Other Ambulatory Visit: Payer: Self-pay | Admitting: Family Medicine

## 2022-02-04 ENCOUNTER — Other Ambulatory Visit (HOSPITAL_BASED_OUTPATIENT_CLINIC_OR_DEPARTMENT_OTHER): Payer: Self-pay

## 2022-02-04 DIAGNOSIS — F411 Generalized anxiety disorder: Secondary | ICD-10-CM

## 2022-02-04 DIAGNOSIS — F331 Major depressive disorder, recurrent, moderate: Secondary | ICD-10-CM

## 2022-02-07 ENCOUNTER — Other Ambulatory Visit (HOSPITAL_BASED_OUTPATIENT_CLINIC_OR_DEPARTMENT_OTHER): Payer: Self-pay

## 2022-02-08 ENCOUNTER — Other Ambulatory Visit: Payer: Self-pay | Admitting: Family Medicine

## 2022-02-08 ENCOUNTER — Other Ambulatory Visit (HOSPITAL_BASED_OUTPATIENT_CLINIC_OR_DEPARTMENT_OTHER): Payer: Self-pay

## 2022-02-08 ENCOUNTER — Encounter (HOSPITAL_BASED_OUTPATIENT_CLINIC_OR_DEPARTMENT_OTHER): Payer: Self-pay

## 2022-02-08 DIAGNOSIS — F331 Major depressive disorder, recurrent, moderate: Secondary | ICD-10-CM

## 2022-02-08 DIAGNOSIS — F411 Generalized anxiety disorder: Secondary | ICD-10-CM

## 2022-02-08 MED ORDER — ESCITALOPRAM OXALATE 20 MG PO TABS
ORAL_TABLET | ORAL | 0 refills | Status: DC
  Filled 2022-02-08: qty 30, fill #0

## 2022-02-14 ENCOUNTER — Telehealth (INDEPENDENT_AMBULATORY_CARE_PROVIDER_SITE_OTHER): Payer: 59 | Admitting: Family Medicine

## 2022-02-14 ENCOUNTER — Other Ambulatory Visit (HOSPITAL_BASED_OUTPATIENT_CLINIC_OR_DEPARTMENT_OTHER): Payer: Self-pay

## 2022-02-14 ENCOUNTER — Encounter: Payer: Self-pay | Admitting: Family Medicine

## 2022-02-14 DIAGNOSIS — F411 Generalized anxiety disorder: Secondary | ICD-10-CM

## 2022-02-14 DIAGNOSIS — F331 Major depressive disorder, recurrent, moderate: Secondary | ICD-10-CM

## 2022-02-14 DIAGNOSIS — R42 Dizziness and giddiness: Secondary | ICD-10-CM

## 2022-02-14 DIAGNOSIS — J4540 Moderate persistent asthma, uncomplicated: Secondary | ICD-10-CM

## 2022-02-14 DIAGNOSIS — J4541 Moderate persistent asthma with (acute) exacerbation: Secondary | ICD-10-CM

## 2022-02-14 MED ORDER — MECLIZINE HCL 25 MG PO TABS
25.0000 mg | ORAL_TABLET | Freq: Three times a day (TID) | ORAL | 0 refills | Status: DC | PRN
  Filled 2022-02-14: qty 60, 20d supply, fill #0

## 2022-02-14 MED ORDER — FLUOXETINE HCL 10 MG PO CAPS
ORAL_CAPSULE | ORAL | 0 refills | Status: DC
  Filled 2022-02-14: qty 46, 30d supply, fill #0

## 2022-02-14 MED ORDER — ALBUTEROL SULFATE HFA 108 (90 BASE) MCG/ACT IN AERS
INHALATION_SPRAY | RESPIRATORY_TRACT | 1 refills | Status: DC
  Filled 2022-02-14: qty 36, 50d supply, fill #0
  Filled 2022-04-01: qty 20.1, 75d supply, fill #1
  Filled 2022-05-12: qty 13.4, 60d supply, fill #2
  Filled 2022-05-23: qty 13.4, 50d supply, fill #2

## 2022-02-14 MED ORDER — ESCITALOPRAM OXALATE 5 MG PO TABS
ORAL_TABLET | ORAL | 0 refills | Status: DC
  Filled 2022-02-14: qty 12, 6d supply, fill #0

## 2022-02-14 NOTE — Progress Notes (Signed)
Virtual Visit via Video Note  I connected with Shannon Bishop on 02/14/22 at 11:30 AM EDT by a video enabled telemedicine application and verified that I am speaking with the correct person using two identifiers.   I discussed the limitations of evaluation and management by telemedicine and the availability of in person appointments. The patient expressed understanding and agreed to proceed.  Patient location: at home Provider location: in office  Subjective:    CC:   Chief Complaint  Patient presents with   Anxiety    Possible change medication     HPI:  Still taking Lexapro and daily.  Feels like wants to get out socially but feels unmotivated. Not sleeping well. Taking melatonin for a week but not helpful.  GAD 7 score of 15 and PHQ- 9 score of 9.    Sertraline Made her feel emotionally flat.  Tried Cymbalta for a week and had sxs but not sure what.  Maybe nausea.  She is reporting feeling down and depressed, trouble sleeping, poor appetite, feeling anxious or nervous nearly every day.  No significant irritability.  He is open to seeing a therapist/counselor.  Says her vertigo has come back. Would like a refill on meclizine.    Needs a refill on her albuterol inhaler.  Used it frequently when she was sick.     Past medical history, Surgical history, Family history not pertinant except as noted below, Social history, Allergies, and medications have been entered into the medical record, reviewed, and corrections made.    Objective:    General: Speaking clearly in complete sentences without any shortness of breath.  Alert and oriented x3.  Normal judgment. No apparent acute distress.    Impression and Recommendations:    Problem List Items Addressed This Visit       Respiratory   Asthma in adult, moderate persistent, uncomplicated    Fill albuterol.      Relevant Medications   albuterol (VENTOLIN HFA) 108 (90 Base) MCG/ACT inhaler     Other   Major  depression   Relevant Medications   FLUoxetine (PROZAC) 10 MG capsule   escitalopram (LEXAPRO) 5 MG tablet   Other Relevant Orders   Ambulatory referral to Behavioral Health   Generalized anxiety disorder - Primary    We discussed options we will try fluoxetine.  Could consider adding Wellbutrin if she needs something energizing on top of that plan to follow back up in 4 weeks we will start with 10 mg daily after tapering off the Lexapro.  And then bump up the fluoxetine after 2 weeks to 20 mg.      Relevant Medications   FLUoxetine (PROZAC) 10 MG capsule   escitalopram (LEXAPRO) 5 MG tablet   Other Relevant Orders   Ambulatory referral to Behavioral Health   Other Visit Diagnoses     Moderate persistent asthma with acute exacerbation       Relevant Medications   albuterol (VENTOLIN HFA) 108 (90 Base) MCG/ACT inhaler   Vertigo           Orders Placed This Encounter  Procedures   Ambulatory referral to Behavioral Health    Referral Priority:   Routine    Referral Type:   Psychiatric    Referral Reason:   Specialty Services Required    Requested Specialty:   Behavioral Health    Number of Visits Requested:   1    Meds ordered this encounter  Medications   FLUoxetine (PROZAC) 10  MG capsule    Sig: Take 1 capsule (10 mg total) by mouth daily for 14 days, THEN 2 capsules (20 mg total) daily for 16 days.    Dispense:  46 capsule    Refill:  0   escitalopram (LEXAPRO) 5 MG tablet    Sig: Take 2 tablets (10 mg total) by mouth daily for 3 days, THEN 1 tablet (5 mg total) daily for 3 days.    Dispense:  12 tablet    Refill:  0   albuterol (VENTOLIN HFA) 108 (90 Base) MCG/ACT inhaler    Sig: INHALE 2 PUFFS BY MOUTH INTO THE LUNGS EVERY 6 (SIX) HOURS AS NEEDED FOR WHEEZING OR SHORTNESS OF BREATH.    Dispense:  36 g    Refill:  1   meclizine (ANTIVERT) 25 MG tablet    Sig: Take 1 tablet (25 mg total) by mouth 3 (three) times daily as needed for dizziness.    Dispense:  60  tablet    Refill:  0   Vertigo-we did discuss that if she is not improving with doing her home exercises and the meclizine then please let me know and we can place a referral for vestibular rehab.  I discussed the assessment and treatment plan with the patient. The patient was provided an opportunity to ask questions and all were answered. The patient agreed with the plan and demonstrated an understanding of the instructions.   The patient was advised to call back or seek an in-person evaluation if the symptoms worsen or if the condition fails to improve as anticipated.   Nani Gasser, MD

## 2022-02-14 NOTE — Assessment & Plan Note (Signed)
We discussed options we will try fluoxetine.  Could consider adding Wellbutrin if she needs something energizing on top of that plan to follow back up in 4 weeks we will start with 10 mg daily after tapering off the Lexapro.  And then bump up the fluoxetine after 2 weeks to 20 mg.

## 2022-02-14 NOTE — Assessment & Plan Note (Signed)
Fill albuterol.

## 2022-04-01 ENCOUNTER — Other Ambulatory Visit (HOSPITAL_BASED_OUTPATIENT_CLINIC_OR_DEPARTMENT_OTHER): Payer: Self-pay

## 2022-04-01 ENCOUNTER — Encounter: Payer: Self-pay | Admitting: Family Medicine

## 2022-04-01 ENCOUNTER — Other Ambulatory Visit: Payer: Self-pay

## 2022-04-01 MED ORDER — FLUOXETINE HCL 20 MG PO CAPS
20.0000 mg | ORAL_CAPSULE | Freq: Every day | ORAL | 0 refills | Status: DC
  Filled 2022-04-01: qty 30, 30d supply, fill #0

## 2022-04-01 NOTE — Telephone Encounter (Signed)
Patient needs refill on prozac and her last visit with you was a virtual visit 02/14/22 please advise med order pending

## 2022-04-01 NOTE — Telephone Encounter (Signed)
Med filled. Please her schedule f/ in about 2 weeks.  Ok for in person or virtual.  Meds ordered this encounter  Medications   FLUoxetine (PROZAC) 20 MG capsule    Sig: Take 1 capsule (20 mg total) by mouth daily.    Dispense:  30 capsule    Refill:  0

## 2022-04-04 ENCOUNTER — Other Ambulatory Visit (HOSPITAL_BASED_OUTPATIENT_CLINIC_OR_DEPARTMENT_OTHER): Payer: Self-pay

## 2022-04-04 NOTE — Telephone Encounter (Signed)
Called patient and LVM for her to schedule. Shannon Bishop

## 2022-04-04 NOTE — Telephone Encounter (Signed)
Can you please get this patient scheduled in 2 weeks in person or virtual.

## 2022-04-25 NOTE — Progress Notes (Signed)
Established Patient Office Visit  Subjective   Patient ID: Shannon Bishop, female   DOB: 01/20/2001 Age: 21 y.o. MRN: [id number]   No chief complaint on file.   HPI Pleasant 21 year old female presenting today for evaluation of acute right knee pain.  Reports that she woke up on Saturday with lateral right knee pain just under the kneecap and severe pain when trying to bear weight on her right leg.  Notes that it has improved somewhat and she is able to put weight on her leg and walk however it is painful when she does more than take a few steps.  Notes that it feels very tight in the area as if she cannot bend it however when she fully bends her knee, there is no pain.  At the worst, the pain was rated 7-8/10 however there are times when she is pain-free.  She has tried using lidocaine patches which help however they are very difficult to get to stay in place.  She has tried icing, Tylenol, ibuprofen without benefit.  Does note that they spent a week out in [place] where they did a lot of hiking.  They return from his trip 1 week ago today.  No sports, surgeries, or prior injury to the right knee.  Currently working as a Development worker, community and notes that the pain is most bothersome then.  Has been doing couple of exercises she found online which seem to help a bit.  Endorses some popping which is painful and some occasional feelings of locking since the pain started.  Objective:    Vitals:   04/26/22 1453  BP: 105/72  Pulse: 79  Height: 5\' 2"  (1.575 m)  Weight: 151 lb (68.5 kg)  SpO2: 98%  BMI (Calculated): 27.61    Physical Exam Vitals and nursing note reviewed.  Constitutional:      General: She is not in acute distress.    Appearance: Normal appearance. She is not ill-appearing.  HENT:     Head: Normocephalic and atraumatic.  Cardiovascular:     Rate and Rhythm: Normal rate and regular rhythm.     Pulses: Normal pulses.     Heart sounds: Normal heart sounds.  Pulmonary:     Effort:  Pulmonary effort is normal. No respiratory distress.     Breath sounds: Normal breath sounds. No wheezing, rhonchi or rales.  Musculoskeletal:     Right knee: Normal range of motion. Tenderness present over the lateral joint line. Normal alignment and normal patellar mobility.     Instability Tests: Anterior drawer test negative. Posterior drawer test negative. Anterior Lachman test negative. Medial McMurray test negative and lateral McMurray test negative.     Left knee: Normal range of motion. No tenderness. Normal alignment and normal patellar mobility.     Instability Tests: Anterior drawer test negative. Posterior drawer test negative. Anterior Lachman test negative. Medial McMurray test negative and lateral McMurray test negative.  Skin:    General: Skin is warm and dry.  Neurological:     Mental Status: She is alert and oriented to person, place, and time.  Psychiatric:        Mood and Affect: Mood normal.        Behavior: Behavior normal.        Thought Content: Thought content normal.        Judgment: Judgment normal.   No results found for this or any previous visit (from the past 24 hour(s)).     The ASCVD  Risk score (Arnett DK, et al., 2019) failed to calculate for the following reasons:   The 2019 ASCVD risk score is only valid for ages 53 to 65   Assessment & Plan:   1. Need for influenza vaccination Flu vaccine given in office today.  2. Acute pain of right knee Unclear etiology.  She does have some mechanical symptoms but these are intermittent and mild.  Discussed possible advanced imaging for meniscal tear however she would like to hold off and do conservative treatment today.  Start meloxicam 15 mg daily.  Getting x-rays today.  Referring to physical therapy for further evaluation and management.  Home exercises printed and given to patient to do while waiting on physical therapy to call.  Recommend wearing a knee brace when dog walking if this provides more  comfort. - Ambulatory referral to Physical Therapy - DG Knee Complete 4 Views Right; Future  Return for reevaluation of right knee pain if no improvement in 4-6 weeks.  ___________________________________________ Thayer Ohm, DNP, APRN, FNP-BC Primary Care and Sports Medicine [facility] [place]

## 2022-04-26 ENCOUNTER — Ambulatory Visit (INDEPENDENT_AMBULATORY_CARE_PROVIDER_SITE_OTHER): Payer: 59

## 2022-04-26 ENCOUNTER — Other Ambulatory Visit (HOSPITAL_BASED_OUTPATIENT_CLINIC_OR_DEPARTMENT_OTHER): Payer: Self-pay

## 2022-04-26 ENCOUNTER — Encounter: Payer: Self-pay | Admitting: Medical-Surgical

## 2022-04-26 ENCOUNTER — Ambulatory Visit: Payer: 59 | Admitting: Medical-Surgical

## 2022-04-26 VITALS — BP 105/72 | HR 79 | Ht 62.0 in | Wt 151.0 lb

## 2022-04-26 DIAGNOSIS — M25561 Pain in right knee: Secondary | ICD-10-CM | POA: Diagnosis not present

## 2022-04-26 DIAGNOSIS — Z23 Encounter for immunization: Secondary | ICD-10-CM | POA: Diagnosis not present

## 2022-04-26 MED ORDER — MELOXICAM 15 MG PO TABS
15.0000 mg | ORAL_TABLET | Freq: Every day | ORAL | 0 refills | Status: DC
  Filled 2022-04-26: qty 30, 30d supply, fill #0

## 2022-04-26 NOTE — Addendum Note (Signed)
Addended by: Beverlee Nims on: 04/26/2022 03:09 PM   Modules accepted: Orders

## 2022-05-12 ENCOUNTER — Other Ambulatory Visit: Payer: Self-pay | Admitting: Family Medicine

## 2022-05-13 ENCOUNTER — Other Ambulatory Visit (HOSPITAL_BASED_OUTPATIENT_CLINIC_OR_DEPARTMENT_OTHER): Payer: Self-pay

## 2022-05-13 NOTE — Telephone Encounter (Signed)
Lvm for patient to call back to schedule  a f/u appt for medication refill on the fluoxetine 20 mg. and this can be a virtual visit .tvt

## 2022-05-13 NOTE — Telephone Encounter (Signed)
LVM for patient 11/10 at 8:34 AM regarding medication refill for prozac 20mg  - 

## 2022-05-13 NOTE — Telephone Encounter (Signed)
Please call pt she will need to schedule a f/u appt for medication refill on the fluoxetine 20 mg. This can be a virtual visit

## 2022-05-18 ENCOUNTER — Other Ambulatory Visit (HOSPITAL_BASED_OUTPATIENT_CLINIC_OR_DEPARTMENT_OTHER): Payer: Self-pay

## 2022-05-18 MED ORDER — FLUOXETINE HCL 20 MG PO CAPS
20.0000 mg | ORAL_CAPSULE | Freq: Every day | ORAL | 0 refills | Status: DC
  Filled 2022-05-18: qty 30, 30d supply, fill #0

## 2022-05-18 NOTE — Telephone Encounter (Signed)
Left anther voicemail for patient 11/15 at 2:43 PM. -Katha Hamming

## 2022-05-23 ENCOUNTER — Other Ambulatory Visit (HOSPITAL_BASED_OUTPATIENT_CLINIC_OR_DEPARTMENT_OTHER): Payer: Self-pay

## 2022-06-10 ENCOUNTER — Ambulatory Visit
Admission: RE | Admit: 2022-06-10 | Discharge: 2022-06-10 | Disposition: A | Discharge status: Home / Self Care | Payer: 59 | Source: Ambulatory Visit | Attending: Family Medicine | Admitting: Family Medicine

## 2022-06-10 ENCOUNTER — Telehealth: Payer: 59 | Admitting: Family Medicine

## 2022-06-10 VITALS — BP 117/73 | HR 88 | Temp 98.6°F | Resp 16

## 2022-06-10 DIAGNOSIS — Z3202 Encounter for pregnancy test, result negative: Secondary | ICD-10-CM | POA: Diagnosis not present

## 2022-06-10 DIAGNOSIS — R1032 Left lower quadrant pain: Secondary | ICD-10-CM | POA: Diagnosis not present

## 2022-06-10 DIAGNOSIS — M545 Low back pain, unspecified: Secondary | ICD-10-CM

## 2022-06-10 LAB — POCT URINALYSIS DIP (MANUAL ENTRY)
Bilirubin, UA: NEGATIVE
Glucose, UA: NEGATIVE mg/dL
Ketones, POC UA: NEGATIVE mg/dL
Leukocytes, UA: NEGATIVE
Nitrite, UA: NEGATIVE
Protein Ur, POC: NEGATIVE mg/dL
Spec Grav, UA: 1.025 (ref 1.010–1.025)
Urobilinogen, UA: 0.2 E.U./dL
pH, UA: 6 (ref 5.0–8.0)

## 2022-06-10 LAB — POCT URINE PREGNANCY: Preg Test, Ur: NEGATIVE

## 2022-06-10 MED ORDER — ACETAMINOPHEN 325 MG PO TABS
650.0000 mg | ORAL_TABLET | Freq: Once | ORAL | Status: AC
  Administered 2022-06-10: 650 mg via ORAL

## 2022-06-10 MED ORDER — ONDANSETRON 4 MG PO TBDP
4.0000 mg | ORAL_TABLET | Freq: Once | ORAL | Status: AC
  Administered 2022-06-10: 4 mg via ORAL

## 2022-06-10 NOTE — Discharge Instructions (Signed)
May take Ibuprofen 200mg, 4 tabs every 8 hours with food.   If symptoms become significantly worse during the night or over the weekend, proceed to the local emergency room.  

## 2022-06-10 NOTE — ED Triage Notes (Addendum)
LLQ pain started at a concert last night  No OTC meds  Cramping  Nausea- zofran given in triage Denies pregnancy, UTI hx, kidney stones or ovarian cysts

## 2022-06-10 NOTE — ED Provider Notes (Signed)
[facility]    CSN: [id number] Arrival date & time: 06/10/22  1526      History   Chief Complaint Chief Complaint  Patient presents with   Abdominal Pain    HPI Shannon Bishop is a 21 y.o. female.   While at a concert last night patient suddenly experienced a sharp pain in her left groin area that gradually began to radiate to her left lower back/sacral area.  She participated in a virtual visit earlier today and advised to seek evaluation in an urgent care center.  She reports that she has had some abdominal bloating during the last 3 days, and some nausea today without vomiting.  Patient's last menstrual period was 05/27/2022 (approximate).  She denies vaginal discharge and urinary symptoms.  She reports that she is not pregnant or sexually active.  She denies history of kidney stones and UTI's. The patient had been on Depo Provera contraception for six years, but stopped receiving it six months ago.  Her periods were irregular immediately after stopping Depo Povera, but now have become regular.  The history is provided by the patient.  Abdominal Pain Pain location:  LLQ Pain quality: sharp   Pain radiation: left lower back. Pain severity:  Moderate Onset quality:  Sudden Duration:  1 day Timing:  Constant Progression:  Unchanged Chronicity:  New Context: not diet changes, not eating, not previous surgeries, not recent illness, not recent sexual activity, not recent travel, not suspicious food intake and not trauma   Relieved by:  None tried Worsened by:  Nothing Ineffective treatments:  None tried Associated symptoms: nausea   Associated symptoms: no anorexia, no chills, no constipation, no cough, no diarrhea, no dysuria, no fatigue, no fever, no flatus, no hematuria, no vaginal bleeding, no vaginal discharge and no vomiting   Risk factors: not pregnant     Past Medical History:  Diagnosis Date   ADHD (attention deficit hyperactivity disorder)    Anxiety     Asthma    Depression    Environmental allergies     Patient Active Problem List   Diagnosis Date Noted   Ganglion cyst of flexor tendon sheath of finger of right hand 04/09/2020   Migraine with aura and without status migrainosus, not intractable 11/12/2019   Asthma in adult, moderate persistent, uncomplicated 12/04/2018   Menorrhagia with regular cycle 05/10/2017   Skin hypopigmentation 05/10/2017   Generalized anxiety disorder 01/07/2016   Major depression 11/26/2015   ADHD (attention deficit hyperactivity disorder) 06/29/2012   Chronic seasonal allergic rhinitis 03/15/2010    History reviewed. No pertinent surgical history.  OB History   No obstetric history on file.      Home Medications    Prior to Admission medications   Medication Sig Start Date End Date Taking? Authorizing Provider  albuterol (PROVENTIL) (2.5 MG/3ML) 0.083% nebulizer solution Take 3 mLs (2.5 mg total) by nebulization every 6 (six) hours as needed for wheezing or shortness of breath (please include nebulizer machine, hoses, and mask if needed.). 12/14/21   Agapito Games, MD  albuterol (VENTOLIN HFA) 108 (90 Base) MCG/ACT inhaler INHALE 2 PUFFS BY MOUTH INTO THE LUNGS EVERY 6 (SIX) HOURS AS NEEDED FOR WHEEZING OR SHORTNESS OF BREATH. 02/14/22 02/14/23  Agapito Games, MD  FLUoxetine (PROZAC) 20 MG capsule Take 1 capsule (20 mg total) by mouth daily. 05/18/22   Agapito Games, MD  meclizine (ANTIVERT) 25 MG tablet Take 1 tablet (25 mg total) by mouth 3 (three) times  daily as needed for dizziness. 02/14/22   Agapito Games, MD  meloxicam (MOBIC) 15 MG tablet Take 1 tablet (15 mg total) by mouth daily. Patient not taking: Reported on 06/10/2022 04/26/22   Christen Butter, NP  predniSONE (DELTASONE) 20 MG tablet Take 2 tablets (40 mg total) by mouth daily with breakfast. Patient not taking: Reported on 06/10/2022 01/11/22   Agapito Games, MD  rizatriptan (MAXALT) 10 MG tablet Take  1 tablet (10 mg total) by mouth as needed for migraine. May repeat in 2 hours if needed Patient not taking: Reported on 06/10/2022 11/12/20   Agapito Games, MD    Family History Family History  Problem Relation Age of Onset   Asthma Mother    Irritable bowel syndrome Mother    Hypertension Maternal Grandmother    Heart disease Maternal Grandmother    Cancer Maternal Grandmother    Irritable bowel syndrome Sister     Social History Social History   Tobacco Use   Smoking status: Never   Smokeless tobacco: Never  Vaping Use   Vaping Use: Every day   Substances: Nicotine, Flavoring  Substance Use Topics   Alcohol use: No   Drug use: No     Allergies   Amoxicillin and Trintellix [vortioxetine]   Review of Systems Review of Systems  Constitutional:  Positive for appetite change. Negative for activity change, chills, diaphoresis, fatigue and fever.  HENT: Negative.    Eyes: Negative.   Respiratory:  Negative for cough.   Cardiovascular: Negative.   Gastrointestinal:  Positive for abdominal pain and nausea. Negative for abdominal distention, anorexia, constipation, diarrhea, flatus and vomiting.  Genitourinary:  Negative for dysuria, flank pain, frequency, hematuria, menstrual problem, pelvic pain, vaginal bleeding, vaginal discharge and vaginal pain.  Musculoskeletal: Negative.   Neurological: Negative.   Hematological:  Negative for adenopathy.     Physical Exam Triage Vital Signs ED Triage Vitals  Enc Vitals Group     BP 06/10/22 1553 117/73     Pulse Rate 06/10/22 1553 88     Resp 06/10/22 1553 16     Temp 06/10/22 1553 98.6 F (37 C)     Temp Source 06/10/22 1553 Oral     SpO2 06/10/22 1553 96 %     Weight --      Height --      Head Circumference --      Peak Flow --      Pain Score 06/10/22 1555 6     Pain Loc --      Pain Edu? --      Excl. in GC? --    No data found.  Updated Vital Signs BP 117/73 (BP Location: Right Arm)   Pulse 88    Temp 98.6 F (37 C) (Oral)   Resp 16   LMP 05/27/2022 (Approximate)   SpO2 96%   Visual Acuity Right Eye Distance:   Left Eye Distance:   Bilateral Distance:    Right Eye Near:   Left Eye Near:    Bilateral Near:     Physical Exam Vitals and nursing note reviewed.  Constitutional:      General: She is not in acute distress. HENT:     Head: Normocephalic.     Mouth/Throat:     Mouth: Mucous membranes are moist.  Eyes:     Conjunctiva/sclera: Conjunctivae normal.     Pupils: Pupils are equal, round, and reactive to light.  Cardiovascular:     Rate and  Rhythm: Normal rate and regular rhythm.     Heart sounds: Normal heart sounds.  Pulmonary:     Breath sounds: Normal breath sounds.  Abdominal:     General: Abdomen is flat. Bowel sounds are normal.     Palpations: Abdomen is soft. There is no hepatomegaly, splenomegaly or mass.     Tenderness: There is abdominal tenderness in the left lower quadrant. There is no right CVA tenderness, left CVA tenderness, guarding or rebound. Negative signs include psoas sign and obturator sign.     Hernia: There is no hernia in the umbilical area or ventral area.       Comments: Vague mild tenderness to palpation left lower quadrant as noted on diagram.    Musculoskeletal:     Cervical back: Neck supple.     Right lower leg: No edema.     Left lower leg: No edema.  Lymphadenopathy:     Cervical: No cervical adenopathy.  Skin:    General: Skin is warm and dry.     Findings: No rash.  Neurological:     Mental Status: She is alert and oriented to person, place, and time.     UC Treatments / Results  Labs (all labs ordered are listed, but only abnormal results are displayed) Labs Reviewed  POCT URINALYSIS DIP (MANUAL ENTRY) - Abnormal; Notable for the following components:      Result Value   Blood, UA trace-intact (*)    All other components within normal limits  CBC WITH DIFFERENTIAL/PLATELET  POCT URINE PREGNANCY negative     EKG   Radiology No results found.  Procedures Procedures (including critical care time)  Medications Ordered in UC Medications  acetaminophen (TYLENOL) tablet 650 mg (650 mg Oral Given 06/10/22 1619)  ondansetron (ZOFRAN-ODT) disintegrating tablet 4 mg (4 mg Oral Given 06/10/22 1555)    Initial Impression / Assessment and Plan / UC Course  I have reviewed the triage vital signs and the nursing notes.  Pertinent labs & imaging results that were available during my care of the patient were reviewed by me and considered in my medical decision making (see chart for details).    Patient's symptoms are highly suggestive of mid-cycle pain (mittelschmerz); ?ruptured ovarian cyst. CBC pending. Recommend abdominal/pelvic US. Followup with Family Doctor in 3 days.  Final Clinical Impressions(s) / UC Diagnoses   Final diagnoses:  Abdominal pain, left lower quadrant     Discharge Instructions      May take Ibuprofen 200mg , 4 tabs every 8 hours with food.   If symptoms become significantly worse during the night or over the weekend, proceed to the local emergency room.      ED Prescriptions   None       , MD 06/12/22 1311

## 2022-06-10 NOTE — Patient Instructions (Signed)
  Governor Rooks, thank you for joining Freddy Finner, NP for today's virtual visit.  While this provider is not your primary care provider (PCP), if your PCP is located in our provider database this encounter information will be shared with them immediately following your visit.   A [facility] MyChart account gives you access to today's visit and all your visits, tests, and labs performed at [facility] " click here if you don't have a [facility] MyChart account or go to [email or web address]  Consent: (Patient) LARKIN ALFRED provided verbal consent for this virtual visit at the beginning of the encounter.  Current Medications:  Current Outpatient Medications:    albuterol (PROVENTIL) (2.5 MG/3ML) 0.083% nebulizer solution, Take 3 mLs (2.5 mg total) by nebulization every 6 (six) hours as needed for wheezing or shortness of breath (please include nebulizer machine, hoses, and mask if needed.)., Disp: 60 mL, Rfl: 0   albuterol (VENTOLIN HFA) 108 (90 Base) MCG/ACT inhaler, INHALE 2 PUFFS BY MOUTH INTO THE LUNGS EVERY 6 (SIX) HOURS AS NEEDED FOR WHEEZING OR SHORTNESS OF BREATH., Disp: 36 g, Rfl: 1   FLUoxetine (PROZAC) 20 MG capsule, Take 1 capsule (20 mg total) by mouth daily., Disp: 30 capsule, Rfl: 0   meclizine (ANTIVERT) 25 MG tablet, Take 1 tablet (25 mg total) by mouth 3 (three) times daily as needed for dizziness., Disp: 60 tablet, Rfl: 0   meloxicam (MOBIC) 15 MG tablet, Take 1 tablet (15 mg total) by mouth daily., Disp: 30 tablet, Rfl: 0   predniSONE (DELTASONE) 20 MG tablet, Take 2 tablets (40 mg total) by mouth daily with breakfast., Disp: 10 tablet, Rfl: 0   rizatriptan (MAXALT) 10 MG tablet, Take 1 tablet (10 mg total) by mouth as needed for migraine. May repeat in 2 hours if needed, Disp: 10 tablet, Rfl: 3   Medications ordered in this encounter:  No orders of the defined types were placed in this encounter.    *If you need refills on other medications prior  to your next appointment, please contact your pharmacy*  Follow-Up: Call back or seek an in-person evaluation if the symptoms worsen or if the condition fails to improve as anticipated.  [facility] [phone]  Other Instructions     If you have been instructed to have an in-person evaluation today at a local Urgent Care facility, please use the link below. It will take you to a list of all of our available [facility], including address, phone number and hours of operation. Please do not delay care.  [facility]  If you or a family member do not have a primary care provider, use the link below to schedule a visit and establish care. When you choose a [facility] primary care physician or advanced practice provider, you gain a long-term partner in health. Find a Primary Care Provider  Learn more about [facility] in-office and virtual care options: [facility] - Get Care Now

## 2022-06-10 NOTE — Progress Notes (Signed)
Virtual Visit Consent   ODYSSEY VASBINDER, you are scheduled for a virtual visit with a [facility] provider today. Just as with appointments in the office, your consent must be obtained to participate. Your consent will be active for this visit and any virtual visit you may have with one of our providers in the next 365 days. If you have a MyChart account, a copy of this consent can be sent to you electronically.  As this is a virtual visit, video technology does not allow for your provider to perform a traditional examination. This may limit your provider's ability to fully assess your condition. If your provider identifies any concerns that need to be evaluated in person or the need to arrange testing (such as labs, EKG, etc.), we will make arrangements to do so. Although advances in technology are sophisticated, we cannot ensure that it will always work on either your end or our end. If the connection with a video visit is poor, the visit may have to be switched to a telephone visit. With either a video or telephone visit, we are not always able to ensure that we have a secure connection.  By engaging in this virtual visit, you consent to the provision of healthcare and authorize for your insurance to be billed (if applicable) for the services provided during this visit. Depending on your insurance coverage, you may receive a charge related to this service.  I need to obtain your verbal consent now. Are you willing to proceed with your visit today? Shannon Bishop has provided verbal consent on 06/10/2022 for a virtual visit (video or telephone). Freddy Finner, NP  Date: 06/10/2022 2:58 PM  Virtual Visit via Video Note   I, Freddy Finner, connected with  Shannon Bishop  ([id number], 2001-01-30) on 06/10/22 at  3:00 PM EST by a video-enabled telemedicine application and verified that I am speaking with the correct person using two identifiers.  Location: Patient: Virtual Visit Location Patient:  Home Provider: Virtual Visit Location Provider: Home Office   I discussed the limitations of evaluation and management by telemedicine and the availability of in person appointments. The patient expressed understanding and agreed to proceed.    History of Present Illness: Shannon Bishop is a 21 y.o. who identifies as a female who was assigned female at birth, and is being seen today for flank pain.  Last night started with front near groin on left. And it progressed to back pain- same side but closer to midline.  Pain level 6-8/10  Not pregnant or sexual active.  No history of kidney stones or UTI's  Reports nausea and hot flashes, had increase in bloating over last 3 days. Recently stopped birth control- 3 months stopped. LMP was 2 weeks ago.  Problems:  Patient Active Problem List   Diagnosis Date Noted   Ganglion cyst of flexor tendon sheath of finger of right hand 04/09/2020   Migraine with aura and without status migrainosus, not intractable 11/12/2019   Asthma in adult, moderate persistent, uncomplicated 12/04/2018   Menorrhagia with regular cycle 05/10/2017   Skin hypopigmentation 05/10/2017   Generalized anxiety disorder 01/07/2016   Major depression 11/26/2015   ADHD (attention deficit hyperactivity disorder) 06/29/2012   Chronic seasonal allergic rhinitis 03/15/2010    Allergies:  Allergies  Allergen Reactions   Amoxicillin Rash   Trintellix [Vortioxetine] Nausea And Vomiting   Medications:  Current Outpatient Medications:    albuterol (PROVENTIL) (2.5 MG/3ML) 0.083% nebulizer solution, Take 3  mLs (2.5 mg total) by nebulization every 6 (six) hours as needed for wheezing or shortness of breath (please include nebulizer machine, hoses, and mask if needed.)., Disp: 60 mL, Rfl: 0   albuterol (VENTOLIN HFA) 108 (90 Base) MCG/ACT inhaler, INHALE 2 PUFFS BY MOUTH INTO THE LUNGS EVERY 6 (SIX) HOURS AS NEEDED FOR WHEEZING OR SHORTNESS OF BREATH., Disp: 36 g, Rfl: 1   FLUoxetine  (PROZAC) 20 MG capsule, Take 1 capsule (20 mg total) by mouth daily., Disp: 30 capsule, Rfl: 0   meclizine (ANTIVERT) 25 MG tablet, Take 1 tablet (25 mg total) by mouth 3 (three) times daily as needed for dizziness., Disp: 60 tablet, Rfl: 0   meloxicam (MOBIC) 15 MG tablet, Take 1 tablet (15 mg total) by mouth daily., Disp: 30 tablet, Rfl: 0   predniSONE (DELTASONE) 20 MG tablet, Take 2 tablets (40 mg total) by mouth daily with breakfast., Disp: 10 tablet, Rfl: 0   rizatriptan (MAXALT) 10 MG tablet, Take 1 tablet (10 mg total) by mouth as needed for migraine. May repeat in 2 hours if needed, Disp: 10 tablet, Rfl: 3  Observations/Objective: Patient is well-developed, in acute distress.  Resting comfortably  at home.  Head is normocephalic, atraumatic.  No labored breathing.  Speech is clear and coherent with logical content.  Patient is alert and oriented at baseline.    Assessment and Plan:  1. Left groin pain   2. Acute left-sided low back pain without sciatica   Patient is advised to go to local [facility] for urine sample. DDX- stone, ovary cyst or UTI/Pyleo  Follow Up Instructions: I discussed the assessment and treatment plan with the patient. The patient was provided an opportunity to ask questions and all were answered. The patient agreed with the plan and demonstrated an understanding of the instructions.  A copy of instructions were sent to the patient via MyChart unless otherwise noted below.     The patient was advised to call back or seek an in-person evaluation if the symptoms worsen or if the condition fails to improve as anticipated.  Time:  I spent 10 minutes with the patient via telehealth technology discussing the above problems/concerns.    Freddy Finner, NP

## 2022-06-11 ENCOUNTER — Telehealth: Payer: Self-pay | Admitting: Emergency Medicine

## 2022-06-11 LAB — CBC WITH DIFFERENTIAL/PLATELET
Absolute Monocytes: 595 cells/uL (ref 200–950)
Basophils Absolute: 51 cells/uL (ref 0–200)
Basophils Relative: 0.6 %
Eosinophils Absolute: 570 cells/uL — ABNORMAL HIGH (ref 15–500)
Eosinophils Relative: 6.7 %
HCT: 41.6 % (ref 35.0–45.0)
Hemoglobin: 14.8 g/dL (ref 11.7–15.5)
Lymphs Abs: 2236 cells/uL (ref 850–3900)
MCH: 32.6 pg (ref 27.0–33.0)
MCHC: 35.6 g/dL (ref 32.0–36.0)
MCV: 91.6 fL (ref 80.0–100.0)
MPV: 10.7 fL (ref 7.5–12.5)
Monocytes Relative: 7 %
Neutro Abs: 5049 cells/uL (ref 1500–7800)
Neutrophils Relative %: 59.4 %
Platelets: 353 10*3/uL (ref 140–400)
RBC: 4.54 10*6/uL (ref 3.80–5.10)
RDW: 12.1 % (ref 11.0–15.0)
Total Lymphocyte: 26.3 %
WBC: 8.5 10*3/uL (ref 3.8–10.8)

## 2022-06-11 NOTE — Telephone Encounter (Signed)
LMTRC.  Advised if doing well to disregard the call.  Any questions or concerns feel free to contact the office. 

## 2022-06-15 ENCOUNTER — Ambulatory Visit: Payer: 59 | Admitting: Family Medicine

## 2022-06-15 ENCOUNTER — Other Ambulatory Visit (HOSPITAL_BASED_OUTPATIENT_CLINIC_OR_DEPARTMENT_OTHER): Payer: Self-pay

## 2022-06-15 ENCOUNTER — Encounter: Payer: Self-pay | Admitting: Family Medicine

## 2022-06-15 VITALS — BP 126/66 | HR 85 | Temp 98.2°F | Ht 62.0 in | Wt 158.0 lb

## 2022-06-15 DIAGNOSIS — J040 Acute laryngitis: Secondary | ICD-10-CM

## 2022-06-15 DIAGNOSIS — J4541 Moderate persistent asthma with (acute) exacerbation: Secondary | ICD-10-CM | POA: Diagnosis not present

## 2022-06-15 MED ORDER — ALBUTEROL SULFATE HFA 108 (90 BASE) MCG/ACT IN AERS
2.0000 | INHALATION_SPRAY | Freq: Four times a day (QID) | RESPIRATORY_TRACT | 1 refills | Status: DC | PRN
  Filled 2022-06-15: qty 13.4, 50d supply, fill #0

## 2022-06-15 NOTE — Progress Notes (Signed)
Acute Office Visit  Subjective:     Patient ID: Shannon Bishop, female    DOB: 05-26-2001, 21 y.o.   MRN: [id number]  Chief Complaint  Patient presents with   Laryngitis    Patient in office - c/o loss of voice x Monday 06/13/22. Patients denies any upper respiratory symptoms at all.    Asthma    Patient requesitng rx rf of inhalers.    Asthma She complains of cough and shortness of breath. There is no wheezing. Pertinent negatives include no chest pain or fever. Her past medical history is significant for asthma.   Patient is in today for  acute visit.  Pt reports on Monday her voice started to become hoarse and the more she talks, it gets worse. No sore throat, no ear ache. Cough started on Monday. Haven't tried anything for her symptoms. No recent yelling and denies sick contacts. She has asthma but reports no wheezing or SOB.  Review of Systems  Constitutional:  Negative for chills and fever.  HENT:  Negative for congestion and sinus pain.   Respiratory:  Positive for cough and shortness of breath. Negative for wheezing.   Cardiovascular:  Negative for chest pain and palpitations.  All other systems reviewed and are negative.       Objective:    BP 126/66   Pulse 85   Temp 98.2 F (36.8 C)   Ht 5\' 2"  (1.575 m)   Wt 158 lb (71.7 kg)   LMP 05/27/2022 (Approximate)   SpO2 98%   BMI 28.90 kg/m    Physical Exam Vitals and nursing note reviewed.  Constitutional:      Appearance: Normal appearance. She is normal weight.  HENT:     Head: Normocephalic and atraumatic.     Right Ear: Tympanic membrane normal.     Left Ear: Tympanic membrane normal.     Nose: Nose normal.     Mouth/Throat:     Mouth: Mucous membranes are moist.     Pharynx: Oropharynx is clear.  Eyes:     Extraocular Movements: Extraocular movements intact.     Conjunctiva/sclera: Conjunctivae normal.     Pupils: Pupils are equal, round, and reactive to light.  Cardiovascular:     Rate and  Rhythm: Normal rate and regular rhythm.     Pulses: Normal pulses.     Heart sounds: Normal heart sounds.  Pulmonary:     Effort: Pulmonary effort is normal.     Breath sounds: Normal breath sounds.  Neurological:     General: No focal deficit present.     Mental Status: She is alert and oriented to person, place, and time. Mental status is at baseline.  Psychiatric:        Mood and Affect: Mood normal.        Behavior: Behavior normal.        Thought Content: Thought content normal.        Judgment: Judgment normal.    No results found for any visits on 06/15/22.      Assessment & Plan:   Problem List Items Addressed This Visit   None Visit Diagnoses     Laryngitis    -  Primary   Moderate persistent asthma with acute exacerbation       Relevant Medications   albuterol (VENTOLIN HFA) 108 (90 Base) MCG/ACT inhaler       Meds ordered this encounter  Medications   albuterol (VENTOLIN HFA) 108 (90 Base) MCG/ACT  inhaler    Sig: INHALE 2 PUFFS BY MOUTH INTO THE LUNGS EVERY 6 (SIX) HOURS AS NEEDED FOR WHEEZING OR SHORTNESS OF BREATH.    Dispense:  13.4 g    Refill:  1   Likely viral etiology. Supportive treatments with rest, honey with warm tea, warm salt water gargles. Refilled albuterol inhaler.  No follow-ups on file.  Suzan Slick, MD

## 2022-07-25 ENCOUNTER — Ambulatory Visit: Payer: Commercial Managed Care - PPO | Admitting: Physician Assistant

## 2022-07-25 ENCOUNTER — Encounter: Payer: Self-pay | Admitting: Physician Assistant

## 2022-07-25 ENCOUNTER — Other Ambulatory Visit (HOSPITAL_BASED_OUTPATIENT_CLINIC_OR_DEPARTMENT_OTHER): Payer: Self-pay

## 2022-07-25 VITALS — BP 108/71 | HR 74 | Temp 98.6°F | Ht 62.0 in | Wt 152.0 lb

## 2022-07-25 DIAGNOSIS — J4541 Moderate persistent asthma with (acute) exacerbation: Secondary | ICD-10-CM | POA: Diagnosis not present

## 2022-07-25 DIAGNOSIS — R062 Wheezing: Secondary | ICD-10-CM

## 2022-07-25 DIAGNOSIS — R051 Acute cough: Secondary | ICD-10-CM | POA: Diagnosis not present

## 2022-07-25 LAB — POCT INFLUENZA A/B
Influenza A, POC: NEGATIVE
Influenza B, POC: NEGATIVE

## 2022-07-25 LAB — POC COVID19 BINAXNOW: SARS Coronavirus 2 Ag: NEGATIVE

## 2022-07-25 MED ORDER — ALBUTEROL SULFATE HFA 108 (90 BASE) MCG/ACT IN AERS
2.0000 | INHALATION_SPRAY | Freq: Four times a day (QID) | RESPIRATORY_TRACT | 5 refills | Status: DC | PRN
  Filled 2022-07-25 – 2022-07-27 (×2): qty 6.7, 25d supply, fill #0
  Filled 2022-08-16 – 2022-08-25 (×3): qty 6.7, 25d supply, fill #1

## 2022-07-25 MED ORDER — PREDNISONE 20 MG PO TABS
40.0000 mg | ORAL_TABLET | Freq: Every day | ORAL | 0 refills | Status: DC
  Filled 2022-07-25: qty 10, 5d supply, fill #0

## 2022-07-25 MED ORDER — MONTELUKAST SODIUM 10 MG PO TABS
10.0000 mg | ORAL_TABLET | Freq: Every day | ORAL | 3 refills | Status: DC
  Filled 2022-07-25: qty 90, 90d supply, fill #0
  Filled 2022-11-22: qty 90, 90d supply, fill #1

## 2022-07-25 MED ORDER — QVAR REDIHALER 40 MCG/ACT IN AERB
2.0000 | INHALATION_SPRAY | Freq: Two times a day (BID) | RESPIRATORY_TRACT | 5 refills | Status: DC
  Filled 2022-07-25 (×2): qty 10.6, 30d supply, fill #0

## 2022-07-25 NOTE — Progress Notes (Signed)
Acute Office Visit  Subjective:     Patient ID: Shannon Bishop, female    DOB: 01-Nov-2000, 22 y.o.   MRN: [id number]  Chief Complaint  Patient presents with   Cough   Wheezing    HPI Patient is in today for wheezing and coughing. Pt has hx of asthma and was on qvar daily but has not been taking it. She denies any URI symptoms. She has been using albuterol inhaler daily but has ran out. She walks dogs daily and outside a lot in the cold air which she thinks could have something to do with the wheezing. No fever, chills, body aches. Worse wheezing and cough has been for the last 3 days.   .. Active Ambulatory Problems    Diagnosis Date Noted   Chronic seasonal allergic rhinitis 03/15/2010   ADHD (attention deficit hyperactivity disorder) 06/29/2012   Major depression 11/26/2015   Generalized anxiety disorder 01/07/2016   Menorrhagia with regular cycle 05/10/2017   Skin hypopigmentation 05/10/2017   Asthma in adult, moderate persistent, uncomplicated 82/95/6213   Migraine with aura and without status migrainosus, not intractable 11/12/2019   Ganglion cyst of flexor tendon sheath of finger of right hand 04/09/2020   Moderate persistent asthma with exacerbation 07/25/2022   Resolved Ambulatory Problems    Diagnosis Date Noted   ASTHMA 03/15/2010   PNEUMONIA, RIGHT 08/25/2010   ADVERSE DRUG REACTION 08/25/2010   Sprain, lateral patellar retinaculum of the left knee. 04/04/2012   Asthma exacerbation 10/06/2013   Right knee pain 06/09/2015   Laceration, eyelid, left 07/27/2015   Major depressive disorder, recurrent episode, moderate ([place]) 01/07/2016   Viral upper respiratory infection 04/06/2016   Rhomboid muscle pain 06/15/2016   Abdominal pain 11/21/2016   COVID-19 09/12/2019   Past Medical History:  Diagnosis Date   Anxiety    Asthma    Depression    Environmental allergies      ROS  See HPI.     Objective:    BP 108/71   Pulse 74   Temp 98.6 F (37 C)  (Oral)   Ht 5\' 2"  (1.575 m)   Wt 152 lb (68.9 kg)   SpO2 97%   BMI 27.80 kg/m  BP Readings from Last 3 Encounters:  07/25/22 108/71  06/15/22 126/66  06/10/22 117/73   Wt Readings from Last 3 Encounters:  07/25/22 152 lb (68.9 kg)  06/15/22 158 lb (71.7 kg)  04/26/22 151 lb (68.5 kg)    .[name] Results for orders placed or performed in visit on 07/25/22  POC COVID-19  Result Value Ref Range   SARS Coronavirus 2 Ag Negative Negative  POCT Influenza A/B  Result Value Ref Range   Influenza A, POC Negative Negative   Influenza B, POC Negative Negative     Physical Exam Constitutional:      Appearance: Normal appearance.  HENT:     Head: Normocephalic.     Right Ear: Tympanic membrane, ear canal and external ear normal. There is no impacted cerumen.     Left Ear: Tympanic membrane, ear canal and external ear normal. There is no impacted cerumen.     Nose: Nose normal.     Mouth/Throat:     Mouth: Mucous membranes are moist.     Pharynx: No oropharyngeal exudate or posterior oropharyngeal erythema.  Eyes:     Extraocular Movements: Extraocular movements intact.     Conjunctiva/sclera: Conjunctivae normal.     Pupils: Pupils are equal, round, and reactive to  light.  Neck:     Vascular: No carotid bruit.  Cardiovascular:     Rate and Rhythm: Normal rate and regular rhythm.     Pulses: Normal pulses.  Pulmonary:     Effort: Pulmonary effort is normal.     Breath sounds: Wheezing present.     Comments: Best heard in the right upper lung Musculoskeletal:     Cervical back: Normal range of motion and neck supple. No rigidity or tenderness.     Right lower leg: No edema.     Left lower leg: No edema.  Lymphadenopathy:     Cervical: No cervical adenopathy.  Neurological:     General: No focal deficit present.     Mental Status: She is alert and oriented to person, place, and time.  Psychiatric:        Mood and Affect: Mood normal.          Assessment & Plan:   Marland KitchenMarland KitchenNinette was seen today for cough and wheezing.  Diagnoses and all orders for this visit:  Moderate persistent asthma with exacerbation -     albuterol (VENTOLIN HFA) 108 (90 Base) MCG/ACT inhaler; Inhale 2 puffs into the lungs every 6 (six) hours as needed. -     beclomethasone (QVAR REDIHALER) 40 MCG/ACT inhaler; Inhale 2 puffs into the lungs 2 (two) times daily. -     predniSONE (DELTASONE) 20 MG tablet; Take 2 tablets (40 mg total) by mouth daily with breakfast. For 5 days. -     montelukast (SINGULAIR) 10 MG tablet; Take 1 tablet (10 mg total) by mouth at bedtime.  Acute cough -     POC COVID-19 -     POCT Influenza A/B -     albuterol (VENTOLIN HFA) 108 (90 Base) MCG/ACT inhaler; Inhale 2 puffs into the lungs every 6 (six) hours as needed. -     predniSONE (DELTASONE) 20 MG tablet; Take 2 tablets (40 mg total) by mouth daily with breakfast. For 5 days.  Wheezing -     POC COVID-19 -     POCT Influenza A/B -     albuterol (VENTOLIN HFA) 108 (90 Base) MCG/ACT inhaler; Inhale 2 puffs into the lungs every 6 (six) hours as needed. -     predniSONE (DELTASONE) 20 MG tablet; Take 2 tablets (40 mg total) by mouth daily with breakfast. For 5 days.   Vitals are stable Pulse ox 97 percent Negative for covid and flu Prednisone burst given for 5 days Refilled albuterol to use as needed been using daily and discussed needing to restart qvar, refilled, and start singular for prevention of asthma symptoms Follow up with PCP in 3 months or sooner if needed   Iran Planas, PA-C

## 2022-07-25 NOTE — Patient Instructions (Signed)
Asthma Triggers and Exacerbation In this video, you will learn about exposures and other factors that can lead to asthma attacks and trouble controlling asthma. To view the content, go to this web address: [email or web address]  This video will expire on: 03/08/2024. If you need access to this video following this date, please reach out to the healthcare provider who assigned it to you. This information is not intended to replace advice given to you by your health care provider. Make sure you discuss any questions you have with your health care provider. Elsevier Patient Education  [place].

## 2022-07-26 ENCOUNTER — Other Ambulatory Visit (HOSPITAL_BASED_OUTPATIENT_CLINIC_OR_DEPARTMENT_OTHER): Payer: Self-pay

## 2022-07-27 ENCOUNTER — Other Ambulatory Visit (HOSPITAL_BASED_OUTPATIENT_CLINIC_OR_DEPARTMENT_OTHER): Payer: Self-pay

## 2022-07-28 ENCOUNTER — Other Ambulatory Visit (HOSPITAL_BASED_OUTPATIENT_CLINIC_OR_DEPARTMENT_OTHER): Payer: Self-pay

## 2022-07-29 ENCOUNTER — Other Ambulatory Visit (HOSPITAL_BASED_OUTPATIENT_CLINIC_OR_DEPARTMENT_OTHER): Payer: Self-pay

## 2022-08-04 ENCOUNTER — Other Ambulatory Visit (HOSPITAL_BASED_OUTPATIENT_CLINIC_OR_DEPARTMENT_OTHER): Payer: Self-pay

## 2022-08-04 ENCOUNTER — Ambulatory Visit: Payer: Commercial Managed Care - PPO | Admitting: Family Medicine

## 2022-08-04 VITALS — BP 113/65 | HR 84 | Ht 62.0 in | Wt 153.0 lb

## 2022-08-04 DIAGNOSIS — Z3009 Encounter for other general counseling and advice on contraception: Secondary | ICD-10-CM | POA: Diagnosis not present

## 2022-08-04 DIAGNOSIS — N920 Excessive and frequent menstruation with regular cycle: Secondary | ICD-10-CM | POA: Diagnosis not present

## 2022-08-04 DIAGNOSIS — Z3042 Encounter for surveillance of injectable contraceptive: Secondary | ICD-10-CM

## 2022-08-04 DIAGNOSIS — Z113 Encounter for screening for infections with a predominantly sexual mode of transmission: Secondary | ICD-10-CM

## 2022-08-04 DIAGNOSIS — F331 Major depressive disorder, recurrent, moderate: Secondary | ICD-10-CM

## 2022-08-04 LAB — POCT URINE PREGNANCY: Preg Test, Ur: NEGATIVE

## 2022-08-04 MED ORDER — MEDROXYPROGESTERONE ACETATE 150 MG/ML IM SUSP
150.0000 mg | Freq: Once | INTRAMUSCULAR | Status: AC
  Administered 2022-08-04: 150 mg via INTRAMUSCULAR

## 2022-08-04 MED ORDER — FLUOXETINE HCL 40 MG PO CAPS
40.0000 mg | ORAL_CAPSULE | Freq: Every day | ORAL | 1 refills | Status: DC
  Filled 2022-08-04: qty 90, 90d supply, fill #0
  Filled 2022-11-22: qty 90, 90d supply, fill #1

## 2022-08-04 NOTE — Progress Notes (Signed)
Established Patient Office Visit  Subjective   Patient ID: Shannon Bishop, female    DOB: August 06, 2000  Age: 22 y.o. MRN: [id number]  Chief Complaint  Patient presents with   Follow-up    Discuss restarting birth control    HPI  To start birth control again she was previously on Depo-Provera but wanted to give her body a break.  But unfortunately her periods have become very heavy again.  She is not interested in an IUD.  She did really well with Depo she really did not have a major change in her weight and she did not have any side effects with it.  Last time she had intercourse was at the end of December and she just had her period 2 weeks ago which was heavy.  Follow-up depressive disorder-currently on fluoxetine 20 mg daily still struggling with some depressive symptoms.  Nothing specifically stressful right now.     08/04/2022   12:06 PM 02/14/2022   11:28 AM 07/14/2021    2:16 PM  PHQ9 SCORE ONLY  PHQ-9 Total Score 9 9 11        ROS    Objective:     BP 113/65   Pulse 84   Ht 5\' 2"  (1.575 m)   Wt 153 lb (69.4 kg)   SpO2 96%   BMI 27.98 kg/m    Physical Exam Vitals reviewed.  Constitutional:      Appearance: She is well-developed.  HENT:     Head: Normocephalic and atraumatic.  Eyes:     Conjunctiva/sclera: Conjunctivae normal.  Cardiovascular:     Rate and Rhythm: Normal rate.  Pulmonary:     Effort: Pulmonary effort is normal.  Skin:    General: Skin is dry.     Coloration: Skin is not pale.  Neurological:     Mental Status: She is alert and oriented to person, place, and time.  Psychiatric:        Behavior: Behavior normal.      Results for orders placed or performed in visit on 08/04/22  POCT urine pregnancy  Result Value Ref Range   Preg Test, Ur Negative Negative      The ASCVD Risk score (Arnett DK, et al., 2019) failed to calculate for the following reasons:   The 2019 ASCVD risk score is only valid for ages 55 to 36     Assessment & Plan:   Problem List Items Addressed This Visit       Other   Menorrhagia with regular cycle - Primary   Relevant Orders   POCT urine pregnancy (Completed)   Major depression    Increase fluoxetine to 40 mg.  We can always go back down if she would prefer.  We discussed therapy/counseling and she was open to that.  Referral placed to [facility].      Relevant Medications   FLUoxetine (PROZAC) 40 MG capsule   Other Relevant Orders   Ambulatory referral to [place]   Other Visit Diagnoses     Screening examination for STD (sexually transmitted disease)       Relevant Orders   C. trachomatis/N. gonorrhoeae RNA   Encounter for general counseling on prescription of oral contraceptives       Encounter for Depo-Provera contraception       Relevant Medications   medroxyPROGESTERone (DEPO-PROVERA) injection 150 mg (Completed)      Menorrhagia-last menstrual was 2 weeks ago and was heavy and last time she had intercourse was over 4  weeks ago.  She has a negative UPT today.  Will go ahead and administer Depo-Provera.  Discussed that we want to make sure that she is not pregnant before administering the Depo.  Continue to work on getting an adequate calcium as well as regular exercise to prevent bone loss.  Return in about 6 weeks (around 09/15/2022) for change in mood medication dose .    Beatrice Lecher, MD

## 2022-08-04 NOTE — Progress Notes (Signed)
Pt is here for restart of Depo Provera. Urine pregnancy test was done this was negative.   Tolerated injection today in LUOQ well, no immediate complication. Advised to scheduled next depo appointment. Informed to RTC between 4/19-11/04/2022 for next injection. No further questions or concerns.

## 2022-08-04 NOTE — Assessment & Plan Note (Signed)
Increase fluoxetine to 40 mg.  We can always go back down if she would prefer.  We discussed therapy/counseling and she was open to that.  Referral placed to [facility].

## 2022-08-05 LAB — C. TRACHOMATIS/N. GONORRHOEAE RNA
C. trachomatis RNA, TMA: NOT DETECTED
N. gonorrhoeae RNA, TMA: NOT DETECTED

## 2022-08-06 NOTE — Progress Notes (Signed)
Negative for Gonorrhea dn chlamydia

## 2022-08-08 ENCOUNTER — Other Ambulatory Visit (HOSPITAL_BASED_OUTPATIENT_CLINIC_OR_DEPARTMENT_OTHER): Payer: Self-pay

## 2022-08-09 ENCOUNTER — Other Ambulatory Visit (HOSPITAL_BASED_OUTPATIENT_CLINIC_OR_DEPARTMENT_OTHER): Payer: Self-pay

## 2022-08-15 ENCOUNTER — Other Ambulatory Visit (HOSPITAL_BASED_OUTPATIENT_CLINIC_OR_DEPARTMENT_OTHER): Payer: Self-pay

## 2022-08-16 ENCOUNTER — Other Ambulatory Visit (HOSPITAL_BASED_OUTPATIENT_CLINIC_OR_DEPARTMENT_OTHER): Payer: Self-pay

## 2022-08-17 ENCOUNTER — Other Ambulatory Visit (HOSPITAL_BASED_OUTPATIENT_CLINIC_OR_DEPARTMENT_OTHER): Payer: Self-pay

## 2022-08-17 ENCOUNTER — Telehealth: Payer: Self-pay

## 2022-08-17 NOTE — Telephone Encounter (Addendum)
Initiated Prior authorization PQZ:RAQT RediHaler 40MCG/ACT aerosol Via: Covermymeds Case/Key:[id number] Status: approved as of 08/17/22 Reason:no dates given Notified Pt via: Mychart

## 2022-08-18 ENCOUNTER — Other Ambulatory Visit (HOSPITAL_BASED_OUTPATIENT_CLINIC_OR_DEPARTMENT_OTHER): Payer: Self-pay

## 2022-08-22 ENCOUNTER — Other Ambulatory Visit (HOSPITAL_BASED_OUTPATIENT_CLINIC_OR_DEPARTMENT_OTHER): Payer: Self-pay

## 2022-08-23 ENCOUNTER — Other Ambulatory Visit (HOSPITAL_BASED_OUTPATIENT_CLINIC_OR_DEPARTMENT_OTHER): Payer: Self-pay

## 2022-08-25 ENCOUNTER — Encounter: Payer: Self-pay | Admitting: Family Medicine

## 2022-08-25 ENCOUNTER — Other Ambulatory Visit: Payer: Self-pay

## 2022-08-25 ENCOUNTER — Other Ambulatory Visit (HOSPITAL_BASED_OUTPATIENT_CLINIC_OR_DEPARTMENT_OTHER): Payer: Self-pay

## 2022-08-25 DIAGNOSIS — J4541 Moderate persistent asthma with (acute) exacerbation: Secondary | ICD-10-CM

## 2022-08-25 IMAGING — DX DG HAND COMPLETE 3+V*L*
3 series · 3 of 3 positions shown · non-contrast
Comparison: None.

CLINICAL DATA: Recent dog bite at the base of the first metacarpal,
initial encounter

EXAM:
LEFT HAND - COMPLETE 3+ VIEW

[hand pa]
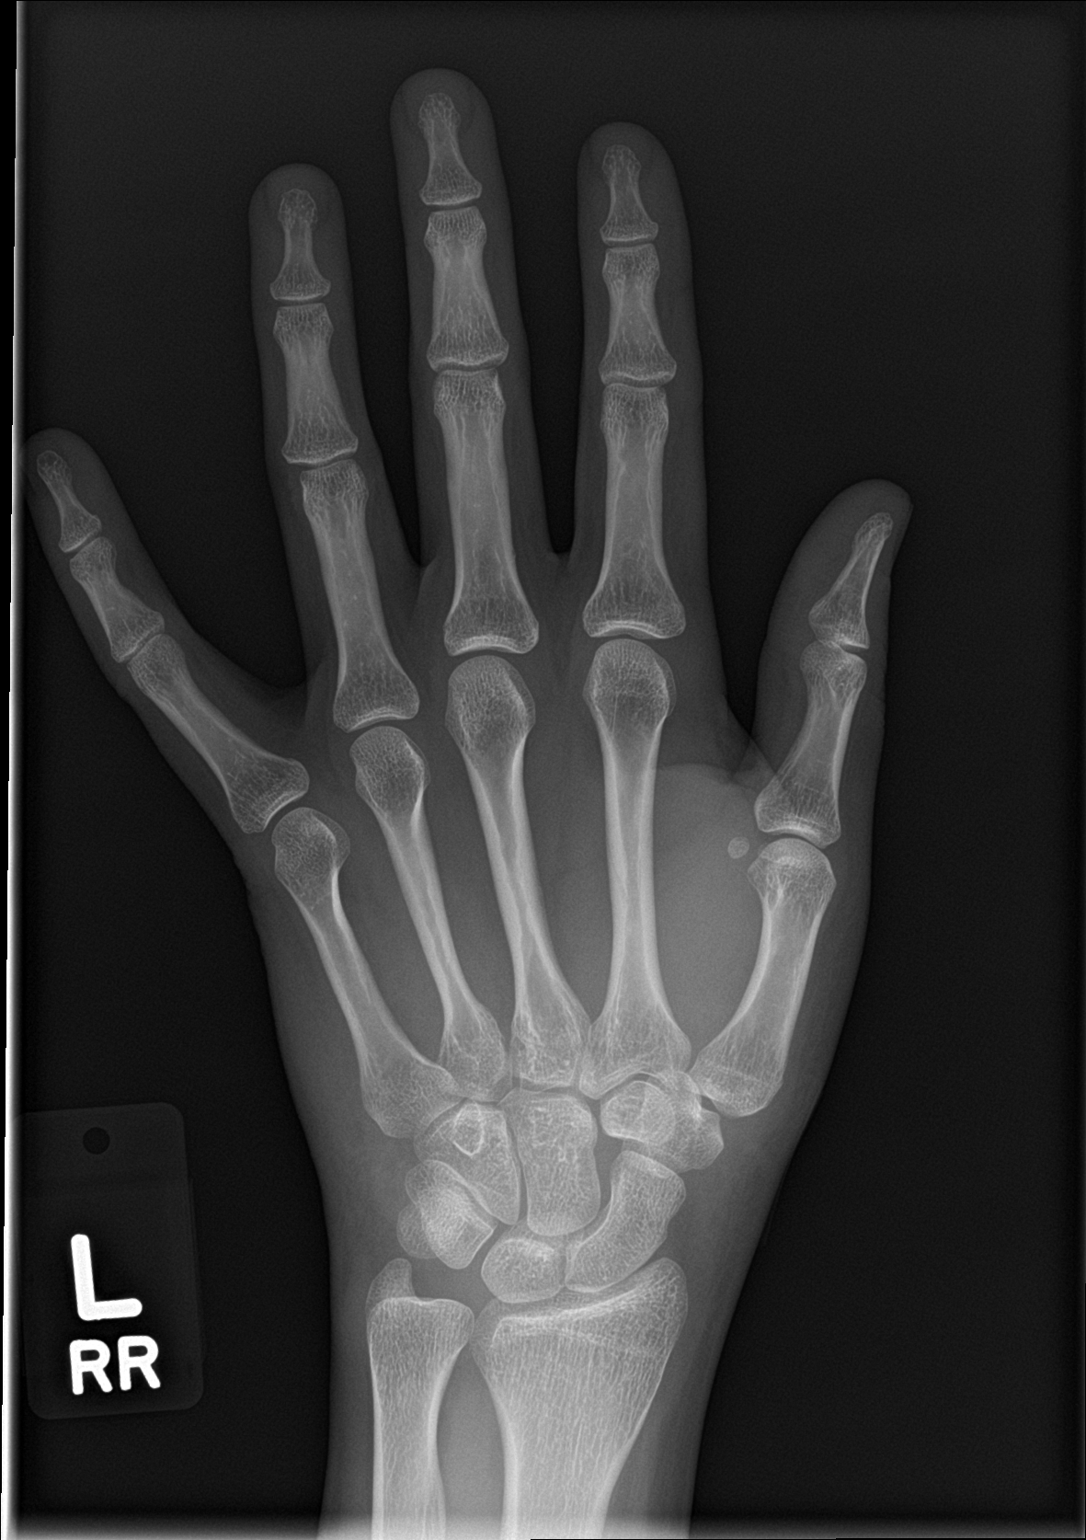

[hand obl]
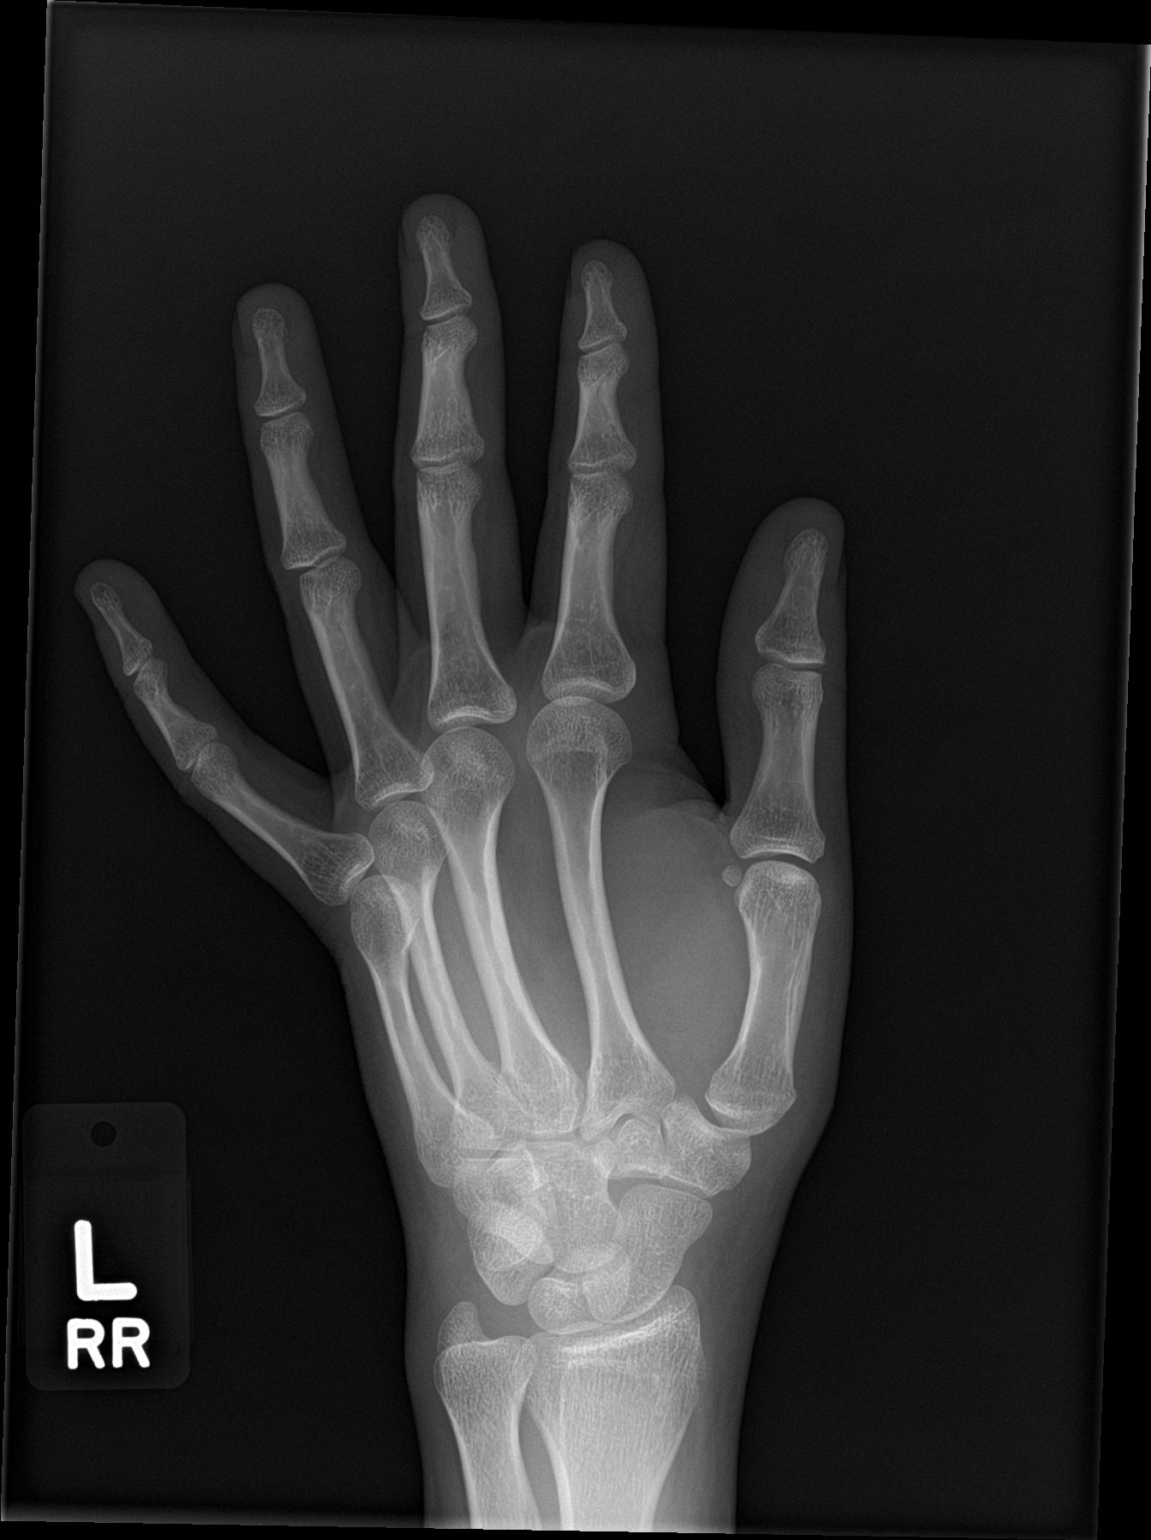

[hand lat]
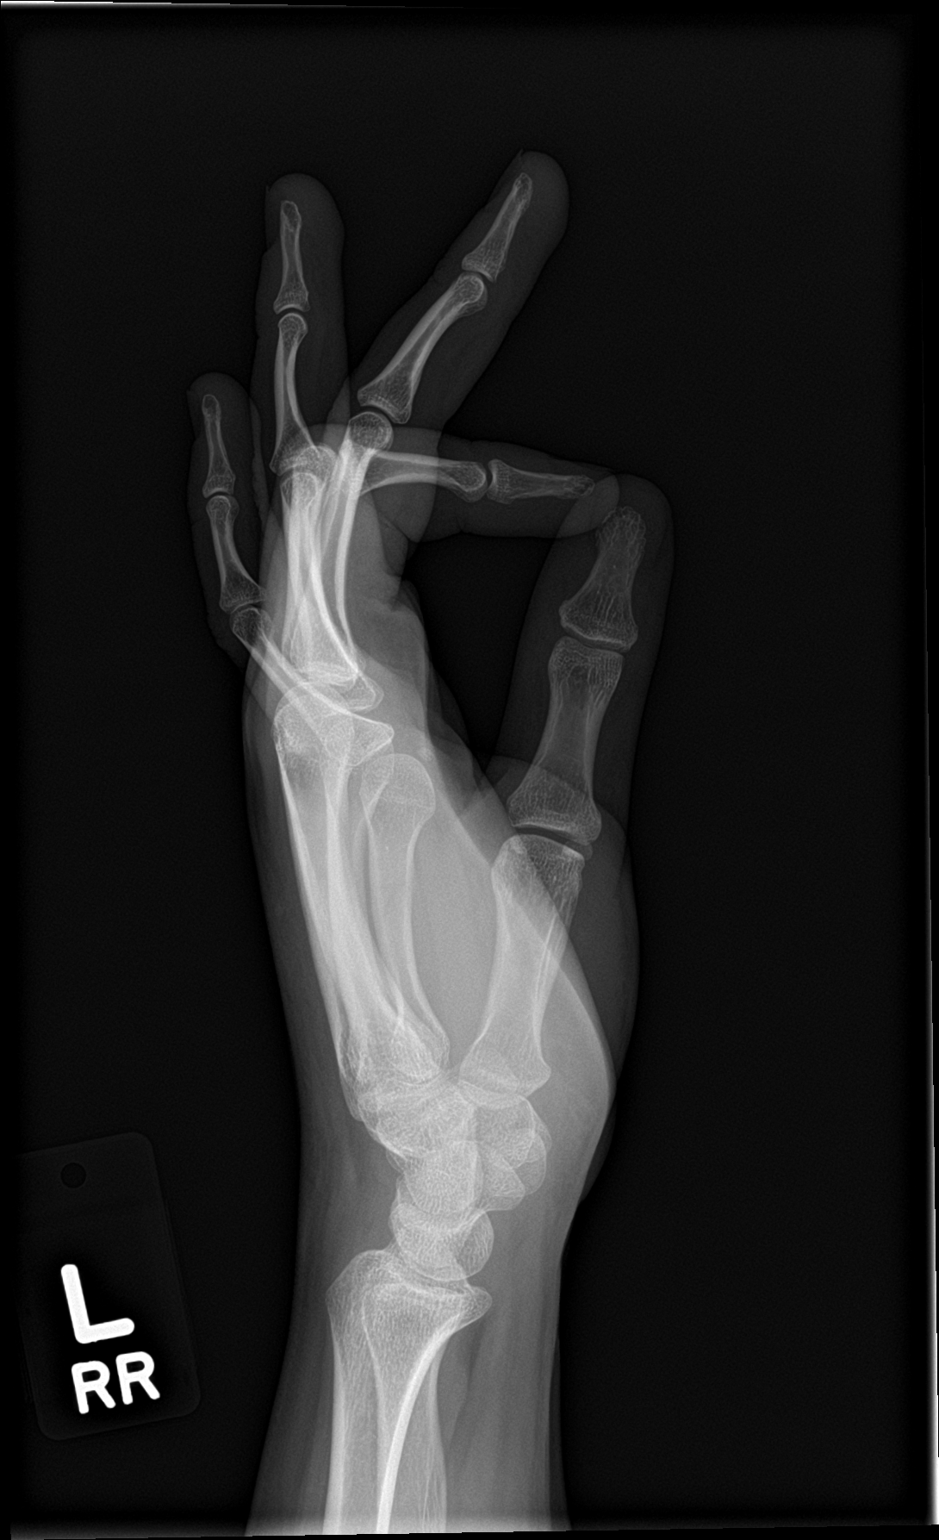

[3 of 3 positions shown; findings below may reference images not displayed]

FINDINGS: There is no evidence of fracture or dislocation. There is no
evidence of arthropathy or other focal bone abnormality. Soft
tissues are unremarkable.
IMPRESSION: No acute abnormality noted.

## 2022-08-25 MED ORDER — ARNUITY ELLIPTA 100 MCG/ACT IN AEPB
1.0000 | INHALATION_SPRAY | Freq: Every day | RESPIRATORY_TRACT | 1 refills | Status: DC
  Filled 2022-08-25: qty 90, 90d supply, fill #0

## 2022-08-25 NOTE — Telephone Encounter (Signed)
Meds ordered this encounter  Medications   Fluticasone Furoate (ARNUITY ELLIPTA) 100 MCG/ACT AEPB    Sig: Inhale 1 puff into the lungs daily.    Dispense:  90 each    Refill:  1

## 2022-08-26 ENCOUNTER — Other Ambulatory Visit: Payer: Self-pay

## 2022-09-02 ENCOUNTER — Other Ambulatory Visit: Payer: Self-pay | Admitting: Physician Assistant

## 2022-09-02 ENCOUNTER — Other Ambulatory Visit (HOSPITAL_BASED_OUTPATIENT_CLINIC_OR_DEPARTMENT_OTHER): Payer: Self-pay

## 2022-09-02 DIAGNOSIS — J4541 Moderate persistent asthma with (acute) exacerbation: Secondary | ICD-10-CM

## 2022-09-05 ENCOUNTER — Other Ambulatory Visit (HOSPITAL_BASED_OUTPATIENT_CLINIC_OR_DEPARTMENT_OTHER): Payer: Self-pay

## 2022-09-15 ENCOUNTER — Ambulatory Visit: Payer: Commercial Managed Care - PPO | Admitting: Family Medicine

## 2022-09-15 ENCOUNTER — Other Ambulatory Visit (HOSPITAL_BASED_OUTPATIENT_CLINIC_OR_DEPARTMENT_OTHER): Payer: Self-pay

## 2022-09-15 ENCOUNTER — Encounter: Payer: Self-pay | Admitting: Family Medicine

## 2022-09-15 VITALS — BP 115/67 | HR 76 | Ht 62.0 in | Wt 150.0 lb

## 2022-09-15 DIAGNOSIS — J4541 Moderate persistent asthma with (acute) exacerbation: Secondary | ICD-10-CM

## 2022-09-15 DIAGNOSIS — F331 Major depressive disorder, recurrent, moderate: Secondary | ICD-10-CM | POA: Diagnosis not present

## 2022-09-15 MED ORDER — QVAR REDIHALER 40 MCG/ACT IN AERB
2.0000 | INHALATION_SPRAY | Freq: Two times a day (BID) | RESPIRATORY_TRACT | 4 refills | Status: AC
  Filled 2022-09-15 – 2022-11-22 (×2): qty 10.6, 30d supply, fill #0

## 2022-09-15 MED ORDER — VENTOLIN HFA 108 (90 BASE) MCG/ACT IN AERS
1.0000 | INHALATION_SPRAY | Freq: Four times a day (QID) | RESPIRATORY_TRACT | 4 refills | Status: DC | PRN
  Filled 2022-09-15: qty 18, 21d supply, fill #0

## 2022-09-15 NOTE — Progress Notes (Signed)
Previous PHQ= 9/SWD   Previous GAD= 1/ND  Pt 's Fluoxetine was increased to 40 mg. She reports that she is doing well on current dose.   Pt stated that the Arnuity Ellipta is not covered by her insurance and she would like to get back on the Qvar.

## 2022-09-15 NOTE — Progress Notes (Signed)
Established Patient Office Visit  Subjective   Patient ID: Shannon Bishop, female    DOB: September 14, 2000  Age: 22 y.o. MRN: [id number]  Chief Complaint  Patient presents with   mood     HPI  6-week follow-up for major depressive disorder.  We decided to increase the fluoxetine to 40 mg.  We also had discussed therapy/counseling and referral was placed.  Last PHQ-9 score was 9.  She has been really happy with the 40 mg dose she has not noticed any side effects and she feels like it actually has been really helpful.  She has not had any major stressors lately.  Sleep is fair but not too bad.  Follow-up asthma-she would like to get a new prescription for the Qvar we were able to get it authorized with her insurance.  She was told that the [place] would be covered but when she went to pick it up it was gone to be $120.  She did she also wanted to discuss her current albuterol she has been getting the generic albuterol from the pharmacy for $5 but she says she has used 5 puffs where in the past when she was on brand Ventolin she could normally use 1 puff and get great relief.  She would like to switch back to the Ventolin if possible.     09/15/2022   10:28 AM 08/04/2022   12:06 PM 02/14/2022   11:28 AM  PHQ9 SCORE ONLY  PHQ-9 Total Score '2 9 9       '$ ROS    Objective:     BP 115/67   Pulse 76   Ht '5\' 2"'$  (1.575 m)   Wt 150 lb (68 kg)   LMP 08/29/2022 (Approximate)   SpO2 98%   BMI 27.44 kg/m    Physical Exam Vitals and nursing note reviewed.  Constitutional:      Appearance: She is well-developed.  HENT:     Head: Normocephalic and atraumatic.  Cardiovascular:     Rate and Rhythm: Normal rate and regular rhythm.     Heart sounds: Normal heart sounds.  Pulmonary:     Effort: Pulmonary effort is normal.     Breath sounds: Normal breath sounds.  Skin:    General: Skin is warm and dry.  Neurological:     Mental Status: She is alert and oriented to person, place, and time.   Psychiatric:        Behavior: Behavior normal.      No results found for any visits on 09/15/22.    The ASCVD Risk score (Arnett DK, et al., 2019) failed to calculate for the following reasons:   The 2019 ASCVD risk score is only valid for ages 28 to 30    Assessment & Plan:   Problem List Items Addressed This Visit       Respiratory   Moderate persistent asthma with exacerbation    Will resend the prescription for Qvar.  She would also like to switch back to brand Ventolin.  Prescription sent to pharmacy for brand medically necessary.  Though it could be much more costly.  It does look like Xopenex generic is covered on her plan so that might be an alternative as well that we can at least try to see if she feels like it is helpful.  She is taking her Singulair daily.  She says she uses it in the morning because if she takes it at night it causes sleep disruption.  Follow-up  in 6 months call sooner if having any problems.      Relevant Medications   beclomethasone (QVAR REDIHALER) 40 MCG/ACT inhaler   VENTOLIN HFA 108 (90 Base) MCG/ACT inhaler     Other   Major depression - Primary    She is actually doing really well PHQ 9 score is down to 2 on 40 mg of fluoxetine.  Continue current regimen and plan to follow-up in 6 months.  If she is doing well at that time we can either continue or taper down.       Return in about 6 months (around 03/18/2023) for Mood medication .    Beatrice Lecher, MD

## 2022-09-15 NOTE — Assessment & Plan Note (Signed)
She is actually doing really well PHQ 9 score is down to 2 on 40 mg of fluoxetine.  Continue current regimen and plan to follow-up in 6 months.  If she is doing well at that time we can either continue or taper down.

## 2022-09-15 NOTE — Assessment & Plan Note (Signed)
Will resend the prescription for Qvar.  She would also like to switch back to brand Ventolin.  Prescription sent to pharmacy for brand medically necessary.  Though it could be much more costly.  It does look like Xopenex generic is covered on her plan so that might be an alternative as well that we can at least try to see if she feels like it is helpful.  She is taking her Singulair daily.  She says she uses it in the morning because if she takes it at night it causes sleep disruption.  Follow-up in 6 months call sooner if having any problems.

## 2022-09-16 ENCOUNTER — Other Ambulatory Visit (HOSPITAL_BASED_OUTPATIENT_CLINIC_OR_DEPARTMENT_OTHER): Payer: Self-pay

## 2022-10-05 ENCOUNTER — Telehealth: Payer: Commercial Managed Care - PPO | Admitting: Physician Assistant

## 2022-10-05 ENCOUNTER — Other Ambulatory Visit (HOSPITAL_BASED_OUTPATIENT_CLINIC_OR_DEPARTMENT_OTHER): Payer: Self-pay

## 2022-10-05 DIAGNOSIS — B9689 Other specified bacterial agents as the cause of diseases classified elsewhere: Secondary | ICD-10-CM

## 2022-10-05 DIAGNOSIS — J4541 Moderate persistent asthma with (acute) exacerbation: Secondary | ICD-10-CM | POA: Diagnosis not present

## 2022-10-05 DIAGNOSIS — J069 Acute upper respiratory infection, unspecified: Secondary | ICD-10-CM | POA: Diagnosis not present

## 2022-10-05 MED ORDER — DOXYCYCLINE HYCLATE 100 MG PO TABS
100.0000 mg | ORAL_TABLET | Freq: Two times a day (BID) | ORAL | 0 refills | Status: DC
  Filled 2022-10-05: qty 20, 10d supply, fill #0

## 2022-10-05 MED ORDER — PREDNISONE 20 MG PO TABS
40.0000 mg | ORAL_TABLET | Freq: Every day | ORAL | 0 refills | Status: DC
  Filled 2022-10-05: qty 10, 5d supply, fill #0

## 2022-10-05 MED ORDER — PROMETHAZINE-DM 6.25-15 MG/5ML PO SYRP
5.0000 mL | ORAL_SOLUTION | Freq: Four times a day (QID) | ORAL | 0 refills | Status: DC | PRN
  Filled 2022-10-05: qty 118, 6d supply, fill #0

## 2022-10-05 NOTE — Patient Instructions (Signed)
Ria Clock, thank you for joining Mar Daring, PA-C for today's virtual visit.  While this provider is not your primary care provider (PCP), if your PCP is located in our provider database this encounter information will be shared with them immediately following your visit.   [facility] account gives you access to today's visit and all your visits, tests, and labs performed at [facility] " click here if you don't have a [facility] account or go to [email or web address]  Consent: (Patient) Shannon Bishop provided verbal consent for this virtual visit at the beginning of the encounter.  Current Medications:  Current Outpatient Medications:    doxycycline (VIBRA-TABS) 100 MG tablet, Take 1 tablet (100 mg total) by mouth 2 (two) times daily., Disp: 20 tablet, Rfl: 0   predniSONE (DELTASONE) 20 MG tablet, Take 2 tablets (40 mg total) by mouth daily with breakfast., Disp: 10 tablet, Rfl: 0   promethazine-dextromethorphan (PROMETHAZINE-DM) 6.25-15 MG/5ML syrup, Take 5 mLs by mouth 4 (four) times daily as needed., Disp: 118 mL, Rfl: 0   albuterol (PROVENTIL) (2.5 MG/3ML) 0.083% nebulizer solution, Take 3 mLs (2.5 mg total) by nebulization every 6 (six) hours as needed for wheezing or shortness of breath (please include nebulizer machine, hoses, and mask if needed.)., Disp: 60 mL, Rfl: 0   beclomethasone (QVAR REDIHALER) 40 MCG/ACT inhaler, Inhale 2 puffs into the lungs 2 (two) times daily., Disp: 10.6 g, Rfl: 4   FLUoxetine (PROZAC) 40 MG capsule, Take 1 capsule (40 mg total) by mouth daily., Disp: 90 capsule, Rfl: 1   meclizine (ANTIVERT) 25 MG tablet, Take 1 tablet (25 mg total) by mouth 3 (three) times daily as needed for dizziness., Disp: 60 tablet, Rfl: 0   montelukast (SINGULAIR) 10 MG tablet, Take 1 tablet (10 mg total) by mouth at bedtime., Disp: 90 tablet, Rfl: 3   rizatriptan (MAXALT) 10 MG tablet, Take 1 tablet (10 mg total) by mouth as  needed for migraine. May repeat in 2 hours if needed, Disp: 10 tablet, Rfl: 3   VENTOLIN HFA 108 (90 Base) MCG/ACT inhaler, Inhale 1-2 puffs into the lungs every 6 (six) hours as needed for wheezing or shortness of breath., Disp: 18 g, Rfl: 4   Medications ordered in this encounter:  Meds ordered this encounter  Medications   predniSONE (DELTASONE) 20 MG tablet    Sig: Take 2 tablets (40 mg total) by mouth daily with breakfast.    Dispense:  10 tablet    Refill:  0    Order Specific Question:   Supervising Provider    Answer:   Chase Picket [id number]   doxycycline (VIBRA-TABS) 100 MG tablet    Sig: Take 1 tablet (100 mg total) by mouth 2 (two) times daily.    Dispense:  20 tablet    Refill:  0    Order Specific Question:   Supervising Provider    Answer:   Chase Picket [id number]   promethazine-dextromethorphan (PROMETHAZINE-DM) 6.25-15 MG/5ML syrup    Sig: Take 5 mLs by mouth 4 (four) times daily as needed.    Dispense:  118 mL    Refill:  0    Order Specific Question:   Supervising Provider    Answer:   Chase Picket [id number]     *If you need refills on other medications prior to your next appointment, please contact your pharmacy*  Follow-Up: Call back or seek an in-person evaluation if the symptoms worsen or if  the condition fails to improve as anticipated.  [facility] [phone]  Other Instructions  Upper Respiratory Infection, Adult An upper respiratory infection (URI) is a common viral infection of the nose, throat, and upper air passages that lead to the lungs. The most common type of URI is the common cold. URIs usually get better on their own, without medical treatment. What are the causes? A URI is caused by a virus. You may catch a virus by: Breathing in droplets from an infected person's cough or sneeze. Touching something that has been exposed to the virus (is contaminated) and then touching your mouth, nose, or eyes. What  increases the risk? You are more likely to get a URI if: You are very young or very old. You have close contact with others, such as at work, school, or a health care facility. You smoke. You have long-term (chronic) heart or lung disease. You have a weakened disease-fighting system (immune system). You have nasal allergies or asthma. You are experiencing a lot of stress. You have poor nutrition. What are the signs or symptoms? A URI usually involves some of the following symptoms: Runny or stuffy (congested) nose. Cough. Sneezing. Sore throat. Headache. Fatigue. Fever. Loss of appetite. Pain in your forehead, behind your eyes, and over your cheekbones (sinus pain). Muscle aches. Redness or irritation of the eyes. Pressure in the ears or face. How is this diagnosed? This condition may be diagnosed based on your medical history and symptoms, and a physical exam. Your health care provider may use a swab to take a mucus sample from your nose (nasal swab). This sample can be tested to determine what virus is causing the illness. How is this treated? URIs usually get better on their own within 7-10 days. Medicines cannot cure URIs, but your health care provider may recommend certain medicines to help relieve symptoms, such as: Over-the-counter cold medicines. Cough suppressants. Coughing is a type of defense against infection that helps to clear the respiratory system, so take these medicines only as recommended by your health care provider. Fever-reducing medicines. Follow these instructions at home: Activity Rest as needed. If you have a fever, stay home from work or school until your fever is gone or until your health care provider says your URI cannot spread to other people (is no longer contagious). Your health care provider may have you wear a face mask to prevent your infection from spreading. Relieving symptoms Gargle with a mixture of salt and water 3-4 times a day or as  needed. To make salt water, completely dissolve -1 tsp (3-6 g) of salt in 1 cup (237 mL) of warm water. Use a cool-mist humidifier to add moisture to the air. This can help you breathe more easily. Eating and drinking  Drink enough fluid to keep your urine pale yellow. Eat soups and other clear broths. General instructions  Take over-the-counter and prescription medicines only as told by your health care provider. These include cold medicines, fever reducers, and cough suppressants. Do not use any products that contain nicotine or tobacco. These products include cigarettes, chewing tobacco, and vaping devices, such as e-cigarettes. If you need help quitting, ask your health care provider. Stay away from secondhand smoke. Stay up to date on all immunizations, including the yearly (annual) flu vaccine. Keep all follow-up visits. This is important. How to prevent the spread of infection to others URIs can be contagious. To prevent the infection from spreading: Wash your hands with soap and water  for at least 20 seconds. If soap and water are not available, use hand sanitizer. Avoid touching your mouth, face, eyes, or nose. Cough or sneeze into a tissue or your sleeve or elbow instead of into your hand or into the air.  Contact a health care provider if: You are getting worse instead of better. You have a fever or chills. Your mucus is brown or red. You have yellow or brown discharge coming from your nose. You have pain in your face, especially when you bend forward. You have swollen neck glands. You have pain while swallowing. You have white areas in the back of your throat. Get help right away if: You have shortness of breath that gets worse. You have severe or persistent: Headache. Ear pain. Sinus pain. Chest pain. You have chronic lung disease along with any of the following: Making high-pitched whistling sounds when you breathe, most often when you breathe out  (wheezing). Prolonged cough (more than 14 days). Coughing up blood. A change in your usual mucus. You have a stiff neck. You have changes in your: Vision. Hearing. Thinking. Mood. These symptoms may be an emergency. Get help right away. Call 911. Do not wait to see if the symptoms will go away. Do not drive yourself to the hospital. Summary An upper respiratory infection (URI) is a common infection of the nose, throat, and upper air passages that lead to the lungs. A URI is caused by a virus. URIs usually get better on their own within 7-10 days. Medicines cannot cure URIs, but your health care provider may recommend certain medicines to help relieve symptoms. This information is not intended to replace advice given to you by your health care provider. Make sure you discuss any questions you have with your health care provider. Document Revised: 01/20/2021 Document Reviewed: 01/20/2021 Elsevier Patient Education  [place].    If you have been instructed to have an in-person evaluation today at a local Urgent Care facility, please use the link below. It will take you to a list of all of our available [facility], including address, phone number and hours of operation. Please do not delay care.  [facility]  If you or a family member do not have a primary care provider, use the link below to schedule a visit and establish care. When you choose a [facility] primary care physician or advanced practice provider, you gain a long-term partner in health. Find a Primary Care Provider  Learn more about [facility]'s in-office and virtual care options: [facility]

## 2022-10-05 NOTE — Progress Notes (Signed)
Virtual Visit Consent   Shannon Bishop, you are scheduled for a virtual visit with a [facility] provider today. Just as with appointments in the office, your consent must be obtained to participate. Your consent will be active for this visit and any virtual visit you may have with one of our providers in the next 365 days. If you have a MyChart account, a copy of this consent can be sent to you electronically.  As this is a virtual visit, video technology does not allow for your provider to perform a traditional examination. This may limit your provider's ability to fully assess your condition. If your provider identifies any concerns that need to be evaluated in person or the need to arrange testing (such as labs, EKG, etc.), we will make arrangements to do so. Although advances in technology are sophisticated, we cannot ensure that it will always work on either your end or our end. If the connection with a video visit is poor, the visit may have to be switched to a telephone visit. With either a video or telephone visit, we are not always able to ensure that we have a secure connection.  By engaging in this virtual visit, you consent to the provision of healthcare and authorize for your insurance to be billed (if applicable) for the services provided during this visit. Depending on your insurance coverage, you may receive a charge related to this service.  I need to obtain your verbal consent now. Are you willing to proceed with your visit today? Shannon Bishop has provided verbal consent on 10/05/2022 for a virtual visit (video or telephone). Mar Daring, PA-C  Date: 10/05/2022 1:10 PM  Virtual Visit via Video Note   I, Mar Daring, connected with  Shannon Bishop  ([id number], Nov 27, 2000) on 10/05/22 at  1:15 PM EDT by a video-enabled telemedicine application and verified that I am speaking with the correct person using two identifiers.  Location: Patient: Virtual Visit  Location Patient: Home Provider: Virtual Visit Location Provider: Home Office   I discussed the limitations of evaluation and management by telemedicine and the availability of in person appointments. The patient expressed understanding and agreed to proceed.    History of Present Illness: Shannon Bishop is a 22 y.o. who identifies as a female who was assigned female at birth, and is being seen today for possible sinus infection.  HPI: Sinusitis This is a new problem. The current episode started in the past 7 days. The problem has been gradually worsening since onset. There has been no fever. Associated symptoms include congestion, coughing, ear pain (right), headaches and sinus pressure. Pertinent negatives include no chills, hoarse voice, shortness of breath or sore throat. (Rhinorrhea, post nasal drainage, myalgias, increased asthma symptoms) Treatments tried: daily inhalers, nebulizer, sinugulair, advil. The treatment provided no relief.      Problems:  Patient Active Problem List   Diagnosis Date Noted   Moderate persistent asthma with exacerbation 07/25/2022   Ganglion cyst of flexor tendon sheath of finger of right hand 04/09/2020   Migraine with aura and without status migrainosus, not intractable 11/12/2019   Asthma in adult, moderate persistent, uncomplicated [id number]   Menorrhagia with regular cycle 05/10/2017   Skin hypopigmentation 05/10/2017   Generalized anxiety disorder 01/07/2016   Major depression 11/26/2015   ADHD (attention deficit hyperactivity disorder) 06/29/2012   Chronic seasonal allergic rhinitis 03/15/2010    Allergies:  Allergies  Allergen Reactions   Amoxicillin Rash   Trintellix [Vortioxetine]  Nausea And Vomiting   Medications:  Current Outpatient Medications:    doxycycline (VIBRA-TABS) 100 MG tablet, Take 1 tablet (100 mg total) by mouth 2 (two) times daily., Disp: 20 tablet, Rfl: 0   predniSONE (DELTASONE) 20 MG tablet, Take 2 tablets (40 mg  total) by mouth daily with breakfast., Disp: 10 tablet, Rfl: 0   promethazine-dextromethorphan (PROMETHAZINE-DM) 6.25-15 MG/5ML syrup, Take 5 mLs by mouth 4 (four) times daily as needed., Disp: 118 mL, Rfl: 0   albuterol (PROVENTIL) (2.5 MG/3ML) 0.083% nebulizer solution, Take 3 mLs (2.5 mg total) by nebulization every 6 (six) hours as needed for wheezing or shortness of breath (please include nebulizer machine, hoses, and mask if needed.)., Disp: 60 mL, Rfl: 0   beclomethasone (QVAR REDIHALER) 40 MCG/ACT inhaler, Inhale 2 puffs into the lungs 2 (two) times daily., Disp: 10.6 g, Rfl: 4   FLUoxetine (PROZAC) 40 MG capsule, Take 1 capsule (40 mg total) by mouth daily., Disp: 90 capsule, Rfl: 1   meclizine (ANTIVERT) 25 MG tablet, Take 1 tablet (25 mg total) by mouth 3 (three) times daily as needed for dizziness., Disp: 60 tablet, Rfl: 0   montelukast (SINGULAIR) 10 MG tablet, Take 1 tablet (10 mg total) by mouth at bedtime., Disp: 90 tablet, Rfl: 3   rizatriptan (MAXALT) 10 MG tablet, Take 1 tablet (10 mg total) by mouth as needed for migraine. May repeat in 2 hours if needed, Disp: 10 tablet, Rfl: 3   VENTOLIN HFA 108 (90 Base) MCG/ACT inhaler, Inhale 1-2 puffs into the lungs every 6 (six) hours as needed for wheezing or shortness of breath., Disp: 18 g, Rfl: 4  Observations/Objective: Patient is well-developed, well-nourished in no acute distress.  Resting comfortably at home.  Head is normocephalic, atraumatic.  No labored breathing.  Speech is clear and coherent with logical content.  Patient is alert and oriented at baseline.    Assessment and Plan: 1. Bacterial upper respiratory infection - predniSONE (DELTASONE) 20 MG tablet; Take 2 tablets (40 mg total) by mouth daily with breakfast.  Dispense: 10 tablet; Refill: 0 - doxycycline (VIBRA-TABS) 100 MG tablet; Take 1 tablet (100 mg total) by mouth 2 (two) times daily.  Dispense: 20 tablet; Refill: 0 - promethazine-dextromethorphan  (PROMETHAZINE-DM) 6.25-15 MG/5ML syrup; Take 5 mLs by mouth 4 (four) times daily as needed.  Dispense: 118 mL; Refill: 0  2. Moderate persistent asthma with acute exacerbation - predniSONE (DELTASONE) 20 MG tablet; Take 2 tablets (40 mg total) by mouth daily with breakfast.  Dispense: 10 tablet; Refill: 0 - doxycycline (VIBRA-TABS) 100 MG tablet; Take 1 tablet (100 mg total) by mouth 2 (two) times daily.  Dispense: 20 tablet; Refill: 0  - Worsening symptoms that have not responded to OTC medications.  - Will give Doxycycline and Prednisone - Promethazine DM for cough - Continue allergy medications.  - Continue asthma medications as prescribed - Steam and humidifier can help - Stay well hydrated and get plenty of rest.  - Seek in person evaluation if no symptom improvement or if symptoms worsen   Follow Up Instructions: I discussed the assessment and treatment plan with the patient. The patient was provided an opportunity to ask questions and all were answered. The patient agreed with the plan and demonstrated an understanding of the instructions.  A copy of instructions were sent to the patient via MyChart unless otherwise noted below.    The patient was advised to call back or seek an in-person evaluation if the symptoms worsen or  if the condition fails to improve as anticipated.  Time:  I spent 10 minutes with the patient via telehealth technology discussing the above problems/concerns.    Mar Daring, PA-C

## 2022-10-21 ENCOUNTER — Ambulatory Visit (INDEPENDENT_AMBULATORY_CARE_PROVIDER_SITE_OTHER): Payer: Commercial Managed Care - PPO | Admitting: Family Medicine

## 2022-10-21 ENCOUNTER — Encounter: Payer: Self-pay | Admitting: Family Medicine

## 2022-10-21 ENCOUNTER — Other Ambulatory Visit (HOSPITAL_BASED_OUTPATIENT_CLINIC_OR_DEPARTMENT_OTHER): Payer: Self-pay

## 2022-10-21 VITALS — BP 107/54 | HR 66 | Ht 62.0 in

## 2022-10-21 DIAGNOSIS — Z3042 Encounter for surveillance of injectable contraceptive: Secondary | ICD-10-CM

## 2022-10-21 DIAGNOSIS — J4541 Moderate persistent asthma with (acute) exacerbation: Secondary | ICD-10-CM

## 2022-10-21 MED ORDER — MEDROXYPROGESTERONE ACETATE 150 MG/ML IM SUSY
PREFILLED_SYRINGE | Freq: Once | INTRAMUSCULAR | Status: AC
  Administered 2022-10-21: 150 mg via INTRAMUSCULAR

## 2022-10-21 MED ORDER — ALBUTEROL SULFATE HFA 108 (90 BASE) MCG/ACT IN AERS
2.0000 | INHALATION_SPRAY | Freq: Four times a day (QID) | RESPIRATORY_TRACT | 3 refills | Status: DC | PRN
  Filled 2022-10-21: qty 6.7, 25d supply, fill #0
  Filled 2022-11-22: qty 6.7, 25d supply, fill #1
  Filled 2022-12-18: qty 6.7, 25d supply, fill #2
  Filled 2023-01-31: qty 6.7, 25d supply, fill #3

## 2022-10-21 NOTE — Progress Notes (Signed)
Agree with documentation as above.   Maxim Bedel, MD  

## 2022-10-21 NOTE — Patient Instructions (Signed)
Return between 01/06/23 and 01/20/2023 for next depoprovera injection as nurse visit.

## 2022-10-21 NOTE — Progress Notes (Signed)
   Established Patient Office Visit  Subjective   Patient ID: Shannon Bishop, female    DOB: 2001/05/30  Age: 22 y.o. MRN: [id number]  Chief Complaint  Patient presents with   Depoprovera injection    Depoprovera injection nurse visit.     HPI  Depoprovera injection nurse visit. Patient denies chest pain, shortness of breath, headaches, mood changes or problems with medication.  ROS    Objective:     BP (!) 107/54   Pulse 66   Ht  (1.575 m)   SpO2 97%   BMI 27.44 kg/m    Physical Exam   No results found for any visits on 10/21/22.    The ASCVD Risk score (Arnett DK, et al., 2019) failed to calculate for the following reasons:   The 2019 ASCVD risk score is only valid for ages 40 to 51    Assessment & Plan:  Depoprovera injection nurse visit. Admin  IM RUOQ. Patient tolerated injection well without complications.  Return between 01/06/23 and 01/20/2023 for next depoprovera injection as nurse visit.  Problem List Items Addressed This Visit   None Visit Diagnoses     Encounter for Depo-Provera contraception    -  Primary   Relevant Medications   medroxyPROGESTERone Acetate SUSY (Completed) (Start on 10/21/2022 10:00 AM)       Return for  Return between 01/06/23 and 01/20/2023 for next depoprovera injection as nurse visit. Elizabeth Palau, LPN

## 2022-11-22 ENCOUNTER — Other Ambulatory Visit (HOSPITAL_BASED_OUTPATIENT_CLINIC_OR_DEPARTMENT_OTHER): Payer: Self-pay

## 2022-11-23 ENCOUNTER — Other Ambulatory Visit (HOSPITAL_BASED_OUTPATIENT_CLINIC_OR_DEPARTMENT_OTHER): Payer: Self-pay

## 2022-12-18 ENCOUNTER — Encounter: Payer: Self-pay | Admitting: Family Medicine

## 2022-12-22 ENCOUNTER — Other Ambulatory Visit (HOSPITAL_BASED_OUTPATIENT_CLINIC_OR_DEPARTMENT_OTHER): Payer: Self-pay

## 2022-12-22 MED ORDER — CLINDAMYCIN HCL 300 MG PO CAPS
300.0000 mg | ORAL_CAPSULE | Freq: Three times a day (TID) | ORAL | 1 refills | Status: DC
  Filled 2022-12-22: qty 21, 7d supply, fill #0
  Filled 2022-12-28: qty 21, 7d supply, fill #1

## 2023-01-11 ENCOUNTER — Ambulatory Visit (INDEPENDENT_AMBULATORY_CARE_PROVIDER_SITE_OTHER): Payer: Commercial Managed Care - PPO | Admitting: Family Medicine

## 2023-01-11 VITALS — BP 113/64 | HR 81

## 2023-01-11 DIAGNOSIS — Z3042 Encounter for surveillance of injectable contraceptive: Secondary | ICD-10-CM

## 2023-01-11 MED ORDER — MEDROXYPROGESTERONE ACETATE 150 MG/ML IM SUSY
150.0000 mg | PREFILLED_SYRINGE | Freq: Once | INTRAMUSCULAR | Status: AC
  Administered 2023-01-11: 150 mg via INTRAMUSCULAR

## 2023-01-11 NOTE — Progress Notes (Signed)
   Established Patient Office Visit  Subjective   Patient ID: Shannon Bishop, female    DOB: [birth year]  Age: 22 y.o. MRN: [id number]  Chief Complaint  Patient presents with   Contraception    HPI  Shannon Bishop is here for a Depo Provera injection. Denies chest pain, shortness of breath, headaches, mood changes or problems with medication. It has been < 14 weeks since her last Depo Provera injection.   ROS    Objective:     BP 113/64   Pulse 81   SpO2 98%    Physical Exam   No results found for any visits on 01/11/23.    The ASCVD Risk score (Arnett DK, et al., 2019) failed to calculate for the following reasons:   The 2019 ASCVD risk score is only valid for ages 38 to 37    Assessment & Plan:  Depo Provera injection - Patient tolerated injection well without complications. Patient advised to schedule next injection between September 25 th - October 9 th.   Problem List Items Addressed This Visit   None Visit Diagnoses     Encounter for Depo-Provera contraception    -  Primary   Relevant Medications   medroxyPROGESTERone Acetate SUSY 150 mg (Completed)       Return in about 12 weeks (around 04/05/2023) for Depo Provera injection. Earna Coder, Janalyn Harder, CMA

## 2023-01-11 NOTE — Progress Notes (Signed)
Agree with documentation as above.   Ambrea Hegler, MD  

## 2023-02-10 ENCOUNTER — Other Ambulatory Visit (HOSPITAL_BASED_OUTPATIENT_CLINIC_OR_DEPARTMENT_OTHER): Payer: Self-pay

## 2023-02-10 ENCOUNTER — Other Ambulatory Visit: Payer: Self-pay | Admitting: Family Medicine

## 2023-02-10 MED ORDER — RIZATRIPTAN BENZOATE 10 MG PO TABS
10.0000 mg | ORAL_TABLET | ORAL | 3 refills | Status: DC | PRN
  Filled 2023-02-10: qty 10, 20d supply, fill #0

## 2023-02-21 ENCOUNTER — Other Ambulatory Visit (HOSPITAL_BASED_OUTPATIENT_CLINIC_OR_DEPARTMENT_OTHER): Payer: Self-pay

## 2023-02-22 ENCOUNTER — Other Ambulatory Visit: Payer: Self-pay | Admitting: Family Medicine

## 2023-02-22 ENCOUNTER — Other Ambulatory Visit (HOSPITAL_BASED_OUTPATIENT_CLINIC_OR_DEPARTMENT_OTHER): Payer: Self-pay

## 2023-02-22 DIAGNOSIS — J4541 Moderate persistent asthma with (acute) exacerbation: Secondary | ICD-10-CM

## 2023-02-22 MED ORDER — ALBUTEROL SULFATE HFA 108 (90 BASE) MCG/ACT IN AERS
2.0000 | INHALATION_SPRAY | Freq: Four times a day (QID) | RESPIRATORY_TRACT | 3 refills | Status: DC | PRN
  Filled 2023-02-22: qty 6.7, 25d supply, fill #0
  Filled 2023-03-16: qty 6.7, 25d supply, fill #1

## 2023-03-20 ENCOUNTER — Other Ambulatory Visit (HOSPITAL_BASED_OUTPATIENT_CLINIC_OR_DEPARTMENT_OTHER): Payer: Self-pay

## 2023-03-20 ENCOUNTER — Ambulatory Visit: Payer: Commercial Managed Care - PPO | Admitting: Family Medicine

## 2023-03-20 ENCOUNTER — Encounter: Payer: Self-pay | Admitting: Family Medicine

## 2023-03-20 VITALS — BP 112/74 | HR 94 | Ht 62.0 in | Wt 151.0 lb

## 2023-03-20 DIAGNOSIS — G43109 Migraine with aura, not intractable, without status migrainosus: Secondary | ICD-10-CM | POA: Diagnosis not present

## 2023-03-20 DIAGNOSIS — J4541 Moderate persistent asthma with (acute) exacerbation: Secondary | ICD-10-CM | POA: Diagnosis not present

## 2023-03-20 DIAGNOSIS — J4540 Moderate persistent asthma, uncomplicated: Secondary | ICD-10-CM | POA: Diagnosis not present

## 2023-03-20 DIAGNOSIS — F330 Major depressive disorder, recurrent, mild: Secondary | ICD-10-CM

## 2023-03-20 DIAGNOSIS — Z23 Encounter for immunization: Secondary | ICD-10-CM | POA: Diagnosis not present

## 2023-03-20 DIAGNOSIS — F411 Generalized anxiety disorder: Secondary | ICD-10-CM

## 2023-03-20 MED ORDER — ALBUTEROL SULFATE HFA 108 (90 BASE) MCG/ACT IN AERS
2.0000 | INHALATION_SPRAY | Freq: Four times a day (QID) | RESPIRATORY_TRACT | 2 refills | Status: DC | PRN
  Filled 2023-03-20: qty 13.4, 50d supply, fill #0
  Filled 2023-04-05: qty 32, 120d supply, fill #0
  Filled 2023-04-07: qty 13.4, 50d supply, fill #0
  Filled 2023-05-05 – 2023-05-17 (×4): qty 13.4, 50d supply, fill #1
  Filled 2023-08-03: qty 13.4, 50d supply, fill #2
  Filled 2023-09-24: qty 13.4, 50d supply, fill #3
  Filled 2023-12-01: qty 13.4, 50d supply, fill #4
  Filled 2024-01-11: qty 13.4, 50d supply, fill #5
  Filled 2024-03-16: qty 13.4, 50d supply, fill #6

## 2023-03-20 MED ORDER — FLUOXETINE HCL 20 MG PO TABS
20.0000 mg | ORAL_TABLET | Freq: Every day | ORAL | 1 refills | Status: DC
  Filled 2023-03-20: qty 30, 30d supply, fill #0

## 2023-03-20 NOTE — Assessment & Plan Note (Signed)
Discussed options for prophylaxis since she is having 3-4 migraines per month.  We discussed Topamax and or a beta-blocker.  She said she will think about it she still trying to pinpoint some of her triggers.

## 2023-03-20 NOTE — Assessment & Plan Note (Signed)
Doing well overall.  PHQ-9 score of 5 but still struggling a little bit with anxiety so we did discuss possibly going up to 60 mg in the fluoxetine to see if that is helpful we will do a trial for 6 weeks.  New prescription sent to pharmacy.

## 2023-03-20 NOTE — Assessment & Plan Note (Signed)
Just encouraged her to call her insurance back since the pharmacy was unable to fill the Qvar but yet her insurance had told her that it was covered.  If she needs [place] to send it somewhere else please let us know if we need to switch inhaled cocoa steroids and let us know as well.

## 2023-03-20 NOTE — Progress Notes (Signed)
Established Patient Office Visit  Subjective   Patient ID: HELEM KWASNIK, female    DOB: 2000/11/05  Age: 22 y.o. MRN: [id number]  Chief Complaint  Patient presents with   mood    HPI  79-month follow-up for major depressive disorder.  She is doing okay overall she is on 40 mg of fluoxetine and does feel like it has been helpful.  Her PHQ-9 score is 5 today.  Rates her symptoms is not difficult.  She says the only thing she still really struggles with is feeling a little bit anxious.  She says she feels that most days that on her GAD-7 she just marked several days that her score was just a 2.  She has been having more migraines recently 3 to 4/month.  She says the Maxalt does work well when she uses it.  Asthma-she has been using her rescue inhaler little bit more over the summer she is feels like that is in part because ever since she got the generic 1 she is having to do more puffs to actually get relief she says typically she will have to do about 8 before she even feels like it is working and she did recently try to fill her Qvar and get back on it.  Her insurance says that it was covered but when the pharmacy ran that they said it was not covered.  So she really needs to get back with her insurance and find out what is going on.    ROS    Objective:     BP 112/74   Pulse 94   Ht 5\' 2"  (1.575 m)   Wt 151 lb (68.5 kg)   SpO2 100%   BMI 27.62 kg/m    Physical Exam Vitals and nursing note reviewed.  Constitutional:      Appearance: Normal appearance.  HENT:     Head: Normocephalic and atraumatic.  Eyes:     Conjunctiva/sclera: Conjunctivae normal.  Cardiovascular:     Rate and Rhythm: Normal rate and regular rhythm.  Pulmonary:     Effort: Pulmonary effort is normal.     Breath sounds: Normal breath sounds.  Skin:    General: Skin is warm and dry.  Neurological:     Mental Status: She is alert.  Psychiatric:        Mood and Affect: Mood normal.      No  results found for any visits on 03/20/23.    The ASCVD Risk score (Arnett DK, et al., 2019) failed to calculate for the following reasons:   The 2019 ASCVD risk score is only valid for ages 27 to 40    Assessment & Plan:   Problem List Items Addressed This Visit       Cardiovascular and Mediastinum   Migraine with aura and without status migrainosus, not intractable    Discussed options for prophylaxis since she is having 3-4 migraines per month.  We discussed Topamax and or a beta-blocker.  She said she will think about it she still trying to pinpoint some of her triggers.      Relevant Medications   FLUoxetine (PROZAC) 20 MG tablet     Respiratory   Asthma in adult, moderate persistent, uncomplicated    Just encouraged her to call her insurance back since the pharmacy was unable to fill the Qvar but yet her insurance had told her that it was covered.  If she needs [place] to send it somewhere else please let us  know if we need to switch inhaled cocoa steroids and let us know as well.      Relevant Medications   albuterol (VENTOLIN HFA) 108 (90 Base) MCG/ACT inhaler     Other   Major depression    Doing well overall.  PHQ-9 score of 5 but still struggling a little bit with anxiety so we did discuss possibly going up to 60 mg in the fluoxetine to see if that is helpful we will do a trial for 6 weeks.  New prescription sent to pharmacy.      Relevant Medications   FLUoxetine (PROZAC) 20 MG tablet   Generalized anxiety disorder    See note below.      Relevant Medications   FLUoxetine (PROZAC) 20 MG tablet   Other Visit Diagnoses     Encounter for immunization    -  Primary   Relevant Orders   Flu vaccine trivalent PF, 6mos and older(Flulaval,Afluria,Fluarix,Fluzone) (Completed)   Moderate persistent asthma with acute exacerbation       Relevant Medications   albuterol (VENTOLIN HFA) 108 (90 Base) MCG/ACT inhaler       We did send any medications that she needs to her  local or mail order pharmacy if needed.  Return in about 6 weeks (around 05/01/2023) for Mood.    Nani Gasser, MD

## 2023-03-20 NOTE — Assessment & Plan Note (Signed)
See note below

## 2023-03-22 ENCOUNTER — Encounter: Payer: Self-pay | Admitting: Family Medicine

## 2023-03-22 ENCOUNTER — Other Ambulatory Visit (HOSPITAL_BASED_OUTPATIENT_CLINIC_OR_DEPARTMENT_OTHER): Payer: Self-pay

## 2023-03-22 DIAGNOSIS — G43109 Migraine with aura, not intractable, without status migrainosus: Secondary | ICD-10-CM

## 2023-03-22 MED ORDER — TOPIRAMATE 25 MG PO TABS
ORAL_TABLET | ORAL | 1 refills | Status: DC
  Filled 2023-03-22: qty 85, 30d supply, fill #0

## 2023-04-05 ENCOUNTER — Other Ambulatory Visit (HOSPITAL_BASED_OUTPATIENT_CLINIC_OR_DEPARTMENT_OTHER): Payer: Self-pay

## 2023-04-06 ENCOUNTER — Ambulatory Visit (INDEPENDENT_AMBULATORY_CARE_PROVIDER_SITE_OTHER): Payer: Commercial Managed Care - PPO

## 2023-04-06 DIAGNOSIS — Z3042 Encounter for surveillance of injectable contraceptive: Secondary | ICD-10-CM | POA: Diagnosis not present

## 2023-04-06 MED ORDER — MEDROXYPROGESTERONE ACETATE 150 MG/ML IM SUSY
150.0000 mg | PREFILLED_SYRINGE | Freq: Once | INTRAMUSCULAR | Status: AC
  Administered 2023-04-06: 150 mg via INTRAMUSCULAR

## 2023-04-06 NOTE — Progress Notes (Signed)
Established Patient Office Visit  Subjective   Patient ID: Shannon Bishop, female    DOB: October 24, 2000  Age: 22 y.o. MRN: [id number]  Chief Complaint  Patient presents with   Contraception    HPI  Shannon Bishop is here for a Depo Provera injection. Denies chest pain, shortness of breath, headaches, mood changes or problems with medication. It has been < 14 weeks since her last Depo Provera injection.    ROS    Objective:     There were no vitals taken for this visit.   Physical Exam   No results found for any visits on 04/06/23.    The ASCVD Risk score (Arnett DK, et al., 2019) failed to calculate for the following reasons:   The 2019 ASCVD risk score is only valid for ages 2 to 19    Assessment & Plan:  Depo Provera injection - Patient tolerated injection well without complications. Patient advised to schedule next injection between December 19 th through January 2nd.   Problem List Items Addressed This Visit   None Visit Diagnoses     Encounter for Depo-Provera contraception    -  Primary   Relevant Medications   medroxyPROGESTERone Acetate SUSY 150 mg (Completed) (Start on 04/06/2023  9:15 AM)       Return in about 12 weeks (around 06/29/2023) for Depo Provera injection. Earna Coder, Janalyn Harder, CMA

## 2023-04-07 ENCOUNTER — Other Ambulatory Visit (HOSPITAL_BASED_OUTPATIENT_CLINIC_OR_DEPARTMENT_OTHER): Payer: Self-pay

## 2023-05-02 ENCOUNTER — Other Ambulatory Visit (HOSPITAL_BASED_OUTPATIENT_CLINIC_OR_DEPARTMENT_OTHER): Payer: Self-pay

## 2023-05-02 ENCOUNTER — Ambulatory Visit (INDEPENDENT_AMBULATORY_CARE_PROVIDER_SITE_OTHER): Payer: Commercial Managed Care - PPO | Admitting: Family Medicine

## 2023-05-02 ENCOUNTER — Other Ambulatory Visit (HOSPITAL_COMMUNITY)
Admission: RE | Admit: 2023-05-02 | Discharge: 2023-05-02 | Disposition: A | Discharge status: Home / Self Care | Payer: Commercial Managed Care - PPO | Source: Ambulatory Visit | Attending: Family Medicine | Admitting: Family Medicine

## 2023-05-02 ENCOUNTER — Encounter: Payer: Self-pay | Admitting: Family Medicine

## 2023-05-02 VITALS — BP 99/68 | HR 88 | Ht 62.0 in | Wt 148.0 lb

## 2023-05-02 DIAGNOSIS — Z23 Encounter for immunization: Secondary | ICD-10-CM | POA: Diagnosis not present

## 2023-05-02 DIAGNOSIS — Z0000 Encounter for general adult medical examination without abnormal findings: Secondary | ICD-10-CM | POA: Diagnosis not present

## 2023-05-02 DIAGNOSIS — Z124 Encounter for screening for malignant neoplasm of cervix: Secondary | ICD-10-CM

## 2023-05-02 DIAGNOSIS — G43109 Migraine with aura, not intractable, without status migrainosus: Secondary | ICD-10-CM

## 2023-05-02 DIAGNOSIS — F330 Major depressive disorder, recurrent, mild: Secondary | ICD-10-CM

## 2023-05-02 DIAGNOSIS — F411 Generalized anxiety disorder: Secondary | ICD-10-CM | POA: Diagnosis not present

## 2023-05-02 DIAGNOSIS — F331 Major depressive disorder, recurrent, moderate: Secondary | ICD-10-CM | POA: Diagnosis not present

## 2023-05-02 MED ORDER — FLUOXETINE HCL 20 MG PO CAPS
20.0000 mg | ORAL_CAPSULE | Freq: Every day | ORAL | 2 refills | Status: AC
  Filled 2023-05-02: qty 90, 90d supply, fill #0
  Filled 2023-08-03: qty 90, 90d supply, fill #1

## 2023-05-02 MED ORDER — FLUOXETINE HCL 40 MG PO CAPS
40.0000 mg | ORAL_CAPSULE | Freq: Every day | ORAL | 2 refills | Status: AC
  Filled 2023-05-02: qty 90, 90d supply, fill #0
  Filled 2023-08-03: qty 90, 90d supply, fill #1

## 2023-05-02 MED ORDER — TOPIRAMATE 50 MG PO TABS
50.0000 mg | ORAL_TABLET | Freq: Two times a day (BID) | ORAL | 1 refills | Status: AC
  Filled 2023-05-02: qty 180, 90d supply, fill #0
  Filled 2023-08-03: qty 180, 90d supply, fill #1

## 2023-05-02 NOTE — Assessment & Plan Note (Signed)
Doing well with migraines the Topamax has been extremely helpful.  Will go ahead and adjust prescription to 50 mg twice a day.  Follow-up in 6 months.

## 2023-05-02 NOTE — Progress Notes (Signed)
Complete physical exam  Patient: Shannon Bishop   DOB: 2001-05-27   22 y.o. Female  MRN: [id number]  Subjective:    Chief Complaint  Patient presents with   Annual Exam    Shannon Bishop is a 22 y.o. female who presents today for a complete physical exam. She reports consuming a general diet.  Walks 2-3 miles 5 days per week.   She generally feels fairly well. She reports sleeping well. She does not have additional problems to discuss today.    Most recent fall risk assessment:    05/02/2023    2:11 PM  Fall Risk   Falls in the past year? 0  Number falls in past yr: 0  Injury with Fall? 0  Risk for fall due to : No Fall Risks  Follow up Falls evaluation completed     Most recent depression screenings:    05/02/2023    2:11 PM 09/15/2022   10:28 AM  PHQ 2/9 Scores  PHQ - 2 Score 1 1  PHQ- 9 Score 4 2        Patient Care Team: Agapito Games, MD as PCP - General   Outpatient Medications Prior to Visit  Medication Sig   albuterol (PROVENTIL) (2.5 MG/3ML) 0.083% nebulizer solution Take 3 mLs (2.5 mg total) by nebulization every 6 (six) hours as needed for wheezing or shortness of breath (please include nebulizer machine, hoses, and mask if needed.).   albuterol (VENTOLIN HFA) 108 (90 Base) MCG/ACT inhaler Inhale 2 puffs into the lungs every 6 (six) hours as needed for wheezing or shortness of breath.   meclizine (ANTIVERT) 25 MG tablet Take 1 tablet (25 mg total) by mouth 3 (three) times daily as needed for dizziness.   montelukast (SINGULAIR) 10 MG tablet Take 1 tablet (10 mg total) by mouth at bedtime.   rizatriptan (MAXALT) 10 MG tablet Take 1 tablet (10 mg total) by mouth as needed for migraine. May repeat in 2 hours if needed   [DISCONTINUED] FLUoxetine (PROZAC) 20 MG tablet Take 1 tablet (20 mg total) by mouth daily.   [DISCONTINUED] FLUoxetine (PROZAC) 40 MG capsule Take 1 capsule (40 mg total) by mouth daily.   [DISCONTINUED] topiramate (TOPAMAX) 25  MG tablet Take 1 tablet (25 mg total) by mouth at bedtime for 7 days, THEN 1 tablet (25 mg total) 2 (two) times daily for 7 days, THEN 2 tablets (50 mg total) 2 (two) times daily for 16 days.   No facility-administered medications prior to visit.    ROS        Objective:     BP 99/68   Pulse 88   Ht 5\' 2"  (1.575 m)   Wt 148 lb (67.1 kg)   SpO2 97%   BMI 27.07 kg/m    Physical Exam Exam conducted with a chaperone present.  Constitutional:      Appearance: Normal appearance.  HENT:     Head: Normocephalic and atraumatic.     Right Ear: Tympanic membrane, ear canal and external ear normal.     Left Ear: Tympanic membrane, ear canal and external ear normal.     Nose: Nose normal.     Mouth/Throat:     Pharynx: Oropharynx is clear.  Eyes:     Extraocular Movements: Extraocular movements intact.     Conjunctiva/sclera: Conjunctivae normal.     Pupils: Pupils are equal, round, and reactive to light.  Neck:     Thyroid: No thyromegaly.  Cardiovascular:  Rate and Rhythm: Normal rate and regular rhythm.  Pulmonary:     Effort: Pulmonary effort is normal.     Breath sounds: Normal breath sounds.  Chest:     Chest wall: No mass.  Breasts:    Right: Normal. No mass, nipple discharge or skin change.     Left: Normal. No mass, nipple discharge or skin change.  Abdominal:     General: Bowel sounds are normal.     Palpations: Abdomen is soft.     Tenderness: There is no abdominal tenderness.  Genitourinary:    Exam position: Supine.     Labia:        Right: No rash or lesion.        Left: No rash or lesion.      Vagina: Normal.     Cervix: Normal.     Uterus: Normal.      Adnexa: Right adnexa normal and left adnexa normal.  Musculoskeletal:        General: No swelling.     Cervical back: Neck supple.  Lymphadenopathy:     Upper Body:     Right upper body: No supraclavicular, axillary or pectoral adenopathy.     Left upper body: No supraclavicular, axillary or  pectoral adenopathy.  Skin:    General: Skin is warm and dry.  Neurological:     Mental Status: She is oriented to person, place, and time.  Psychiatric:        Mood and Affect: Mood normal.        Behavior: Behavior normal.      No results found for any visits on 05/02/23.     Assessment & Plan:    Routine Health Maintenance and Physical Exam  Immunization History  Administered Date(s) Administered   DTaP 09/16/1998, 11/20/1998, 01/21/1999, 04/27/2000, 11/18/2003   HIB (PRP-OMP) 09/16/1998, 11/20/1998, 01/21/1999, 04/27/2000   HPV 9-valent 05/02/2023   Hepatitis B 09/16/1998, 11/20/1998, 12000-01-14   IPV 09/16/1998, 11/20/1998, 12000-01-14, 11/18/2003   Influenza Split 04/27/2000, 05/14/2002, 04/20/2012   Influenza Whole 04/10/2003, 05/07/2004, 06/14/2005, 05/08/2006, 05/25/2007, 04/29/2010   Influenza, Seasonal, Injecte, Preservative Fre 03/20/2023   Influenza,inj,Quad PF,6+ Mos 03/25/2013, 04/13/2016, 06/06/2017, 04/30/2018, 04/03/2019, 04/02/2020, 03/18/2021, 04/26/2022   MMR 08/02/1999, 11/18/2003   Meningococcal Conjugate 03/25/2013   PFIZER(Purple Top)SARS-COV-2 Vaccination 01/30/2020, 02/20/2020   Pneumococcal Conjugate-13 12000-01-14, 08/02/1999   Tdap 02/16/2010, 07/19/2015   Varicella 08/02/1999, 04/29/2010    Health Maintenance  Topic Date Due   HIV Screening  Never done   Hepatitis C Screening  Never done   COVID-19 Vaccine (3 - 2023-24 season) 03/05/2023   Cervical Cancer Screening (Pap smear)  04/21/2023   HPV VACCINES (2 - 3-dose series) 05/30/2023   CHLAMYDIA SCREENING  08/05/2023   DTaP/Tdap/Td (8 - Td or Tdap) 07/18/2025   INFLUENZA VACCINE  Completed   Pneumococcal Vaccine 29-47 Years old  Aged Out    Discussed health benefits of physical activity, and encouraged her to engage in regular exercise appropriate for her age and condition.  Problem List Items Addressed This Visit       Cardiovascular and Mediastinum   Migraine with aura and without  status migrainosus, not intractable    Doing well with migraines the Topamax has been extremely helpful.  Will go ahead and adjust prescription to 50 mg twice a day.  Follow-up in 6 months.      Relevant Medications   FLUoxetine (PROZAC) 40 MG capsule   FLUoxetine (PROZAC) 20 MG capsule   topiramate (TOPAMAX)  50 MG tablet     Other   Major depression   Relevant Medications   FLUoxetine (PROZAC) 40 MG capsule   FLUoxetine (PROZAC) 20 MG capsule   Generalized anxiety disorder   Relevant Medications   FLUoxetine (PROZAC) 40 MG capsule   FLUoxetine (PROZAC) 20 MG capsule   Other Visit Diagnoses     Wellness examination    -  Primary   Relevant Orders   HPV 9-valent vaccine,Recombinat (Completed)   Screening for cervical cancer       Relevant Orders   Cytology - PAP   Need for HPV vaccination       Relevant Orders   HPV 9-valent vaccine,Recombinat (Completed)       Keep up a regular exercise program and make sure you are eating a healthy diet Try to eat 4 servings of dairy a day, or if you are lactose intolerant take a calcium with vitamin D daily.  Your vaccines are up to date.   First Gardasil given today.   Return in about 6 weeks (around 06/13/2023) for Nurse visit for 2nd Gardasil . Also needs 6 mo f/u with PCP for migraines adn Mood .     Nani Gasser, MD

## 2023-05-05 ENCOUNTER — Other Ambulatory Visit (HOSPITAL_BASED_OUTPATIENT_CLINIC_OR_DEPARTMENT_OTHER): Payer: Self-pay

## 2023-05-05 LAB — CYTOLOGY - PAP
Chlamydia: NEGATIVE
Comment: NEGATIVE
Comment: NEGATIVE
Comment: NORMAL
Diagnosis: NEGATIVE
High risk HPV: NEGATIVE
Neisseria Gonorrhea: NEGATIVE

## 2023-05-07 MED ORDER — FLUCONAZOLE 150 MG PO TABS: 150.0000 mg | ORAL_TABLET | Freq: Once | ORAL | 0 refills | Status: AC

## 2023-05-07 NOTE — Addendum Note (Signed)
Addended by: Nani Gasser D on: 05/07/2023 09:16 AM   Modules accepted: Orders

## 2023-05-07 NOTE — Progress Notes (Signed)
Hi Shannon Bishop, your pap is normal. Negative for Gonorrhea and chlamydia. You id have some yeast so I am sending a diflucan to your pharmacy.

## 2023-05-11 ENCOUNTER — Other Ambulatory Visit (HOSPITAL_BASED_OUTPATIENT_CLINIC_OR_DEPARTMENT_OTHER): Payer: Self-pay

## 2023-05-15 ENCOUNTER — Other Ambulatory Visit (HOSPITAL_BASED_OUTPATIENT_CLINIC_OR_DEPARTMENT_OTHER): Payer: Self-pay

## 2023-06-23 ENCOUNTER — Ambulatory Visit (INDEPENDENT_AMBULATORY_CARE_PROVIDER_SITE_OTHER): Payer: Commercial Managed Care - PPO | Admitting: Family Medicine

## 2023-06-23 VITALS — BP 113/77 | HR 83 | Ht 62.0 in | Wt 144.0 lb

## 2023-06-23 DIAGNOSIS — Z3042 Encounter for surveillance of injectable contraceptive: Secondary | ICD-10-CM | POA: Diagnosis not present

## 2023-06-23 DIAGNOSIS — Z23 Encounter for immunization: Secondary | ICD-10-CM | POA: Diagnosis not present

## 2023-06-23 MED ORDER — MEDROXYPROGESTERONE ACETATE 150 MG/ML IM SUSP
150.0000 mg | Freq: Once | INTRAMUSCULAR | Status: AC
  Administered 2023-06-23: 150 mg via INTRAMUSCULAR

## 2023-06-23 NOTE — Progress Notes (Signed)
Pt is here for a Depo Provera injection.   Denies chest pain, shortness of breath, headaches, mood changes or problems with medication.  She is within injection window. Tolerated injection today in RUOQ well, no immediate complication.  Advised to scheduled next depo appointment. Informed to RTC between 3/7-3/21/2025 for next injection. No further questions or concerns.  She was also here for 2nd HPV she can receive her 3rd HPV on or after 09/30/2023

## 2023-08-03 ENCOUNTER — Other Ambulatory Visit: Payer: Self-pay | Admitting: Physician Assistant

## 2023-08-03 DIAGNOSIS — J4541 Moderate persistent asthma with (acute) exacerbation: Secondary | ICD-10-CM

## 2023-08-04 ENCOUNTER — Other Ambulatory Visit (HOSPITAL_BASED_OUTPATIENT_CLINIC_OR_DEPARTMENT_OTHER): Payer: Self-pay

## 2023-08-04 ENCOUNTER — Other Ambulatory Visit: Payer: Self-pay

## 2023-08-04 MED ORDER — MONTELUKAST SODIUM 10 MG PO TABS
10.0000 mg | ORAL_TABLET | Freq: Every day | ORAL | 3 refills | Status: AC
  Filled 2023-08-04: qty 90, 90d supply, fill #0
  Filled 2024-03-16: qty 90, 90d supply, fill #1
  Filled 2024-06-12: qty 90, 90d supply, fill #2

## 2023-09-12 ENCOUNTER — Ambulatory Visit (INDEPENDENT_AMBULATORY_CARE_PROVIDER_SITE_OTHER): Payer: Commercial Managed Care - PPO | Admitting: Physician Assistant

## 2023-09-12 VITALS — BP 109/66 | HR 76

## 2023-09-12 DIAGNOSIS — Z3042 Encounter for surveillance of injectable contraceptive: Secondary | ICD-10-CM | POA: Diagnosis not present

## 2023-09-12 MED ORDER — MEDROXYPROGESTERONE ACETATE 150 MG/ML IM SUSP
150.0000 mg | Freq: Once | INTRAMUSCULAR | Status: AC
  Administered 2023-09-12: 150 mg via INTRAMUSCULAR

## 2023-09-12 NOTE — Patient Instructions (Addendum)
 RTC FOR NEXT INJECTION 5/26-12/11/2023

## 2023-09-12 NOTE — Progress Notes (Signed)
 Pt is here for a Depo Provera injection.  Denies chest pain, shortness of breath, headaches, mood changes or problems with medication.  She is within injection window. Tolerated injection today in LUOQ well, no immediate complication.  Advised to scheduled next depo appointment. Informed to RTC between 5/26-12/11/2023 for next injection. No further questions or concerns.

## 2023-10-31 ENCOUNTER — Encounter: Payer: Self-pay | Admitting: Family Medicine

## 2023-10-31 ENCOUNTER — Ambulatory Visit: Payer: Commercial Managed Care - PPO | Admitting: Family Medicine

## 2023-10-31 VITALS — BP 111/56 | HR 84 | Ht 62.0 in | Wt 146.0 lb

## 2023-10-31 DIAGNOSIS — Z23 Encounter for immunization: Secondary | ICD-10-CM

## 2023-10-31 DIAGNOSIS — F411 Generalized anxiety disorder: Secondary | ICD-10-CM | POA: Diagnosis not present

## 2023-10-31 DIAGNOSIS — H8110 Benign paroxysmal vertigo, unspecified ear: Secondary | ICD-10-CM | POA: Insufficient documentation

## 2023-10-31 DIAGNOSIS — G43109 Migraine with aura, not intractable, without status migrainosus: Secondary | ICD-10-CM | POA: Diagnosis not present

## 2023-10-31 NOTE — Assessment & Plan Note (Signed)
 Tolerating the fluoxetine  really well for her for all she has been very happy with her regimen she takes a total of 60 mg daily and that seems to be working really well she still reports a few days with little interest or pleasure doing things but denies feeling depressed or hopeless.  Still struggling some with sleep quality.  PHQ-9 score of 4 today and GAD-7 score of 2.  No side effects from the medication thus far.  Continue current regimen and follow-up in 6 months.

## 2023-10-31 NOTE — Assessment & Plan Note (Signed)
 She report really well controlled on topamax .  Hasn't had to use her maxalt  recently. Still has one tab at home doesn't want refills right now.  Noticed red dye is a trigger for her.

## 2023-10-31 NOTE — Progress Notes (Signed)
 Established Patient Office Visit  Subjective  Patient ID: Shannon Bishop, female    DOB: [birth year]  Age: 23 y.o. MRN: [id number]  Chief Complaint  Patient presents with   Migraine   mood    HPI 6 mo f/u migraines and mood.   Did want to let me know that she feels like her vertigo has come back just very mildly.  It is more if she looks up or looks down.  She does not have the home exercises anymore she has done vestibular rehab in the past and she does have a prescription for Antivert .      ROS    Objective:     BP (!) 111/56   Pulse 84   Ht 5\' 2"  (1.575 m)   Wt 146 lb (66.2 kg)   SpO2 100%   BMI 26.70 kg/m    Physical Exam Vitals and nursing note reviewed.  Constitutional:      Appearance: Normal appearance.  HENT:     Head: Normocephalic and atraumatic.  Eyes:     Conjunctiva/sclera: Conjunctivae normal.  Cardiovascular:     Rate and Rhythm: Normal rate and regular rhythm.  Pulmonary:     Effort: Pulmonary effort is normal.     Breath sounds: Normal breath sounds.  Skin:    General: Skin is warm and dry.  Neurological:     Mental Status: She is alert.  Psychiatric:        Mood and Affect: Mood normal.     No results found for any visits on 10/31/23.    The ASCVD Risk score (Arnett DK, et al., 2019) failed to calculate for the following reasons:   The 2019 ASCVD risk score is only valid for ages 76 to 92    Assessment & Plan:   Problem List Items Addressed This Visit       Cardiovascular and Mediastinum   Migraine with aura and without status migrainosus, not intractable - Primary   She report really well controlled on topamax .  Hasn't had to use her maxalt  recently. Still has one tab at home doesn't want refills right now.  Noticed red dye is a trigger for her.         Nervous and Auditory   BPPV (benign paroxysmal positional vertigo)   Discussed diagnosis.  Will give her handout to take home and start doing the exercises on her  own I am always happy to refer her to vestibular rehab if at any point she feels like that would be helpful fortunately this particular episode seems more mild.  She does have some Antivert  on hand at home as well.        Other   Generalized anxiety disorder   Tolerating the fluoxetine  really well for her for all she has been very happy with her regimen she takes a total of 60 mg daily and that seems to be working really well she still reports a few days with little interest or pleasure doing things but denies feeling depressed or hopeless.  Still struggling some with sleep quality.  PHQ-9 score of 4 today and GAD-7 score of 2.  No side effects from the medication thus far.  Continue current regimen and follow-up in 6 months.      Other Visit Diagnoses       Need for HPV vaccination       Relevant Orders   HPV 9-valent vaccine,Recombinat (Completed)      Third/last Gardasil administered today.  Return in about 6 months (around 05/01/2024) for Migraines, Mood.    Duaine German, MD

## 2023-10-31 NOTE — Assessment & Plan Note (Signed)
 Discussed diagnosis.  Will give her handout to take home and start doing the exercises on her own I am always happy to refer her to vestibular rehab if at any point she feels like that would be helpful fortunately this particular episode seems more mild.  She does have some Antivert  on hand at home as well.

## 2023-11-17 ENCOUNTER — Other Ambulatory Visit (HOSPITAL_BASED_OUTPATIENT_CLINIC_OR_DEPARTMENT_OTHER): Payer: Self-pay

## 2023-11-17 ENCOUNTER — Other Ambulatory Visit: Payer: Self-pay | Admitting: Family Medicine

## 2023-11-17 MED ORDER — MECLIZINE HCL 25 MG PO TABS
25.0000 mg | ORAL_TABLET | Freq: Three times a day (TID) | ORAL | 0 refills | Status: AC | PRN
  Filled 2023-11-17: qty 60, 20d supply, fill #0

## 2023-11-28 ENCOUNTER — Ambulatory Visit

## 2023-11-30 ENCOUNTER — Other Ambulatory Visit (HOSPITAL_BASED_OUTPATIENT_CLINIC_OR_DEPARTMENT_OTHER): Payer: Self-pay

## 2023-12-18 NOTE — Progress Notes (Unsigned)
   Acute Office Visit  Subjective:     Patient ID: AUSTRALIA DROLL, female    DOB: [birth year], 23 y.o.   MRN: [id number]  No chief complaint on file.   HPI Patient is in today for Cough   ROS      Objective:    There were no vitals taken for this visit. {Vitals History (Optional):23777}  Physical Exam  No results found for any visits on 12/19/23.      Assessment & Plan:   Problem List Items Addressed This Visit   None   No orders of the defined types were placed in this encounter.   No follow-ups on file.  Duaine German, MD

## 2023-12-19 ENCOUNTER — Other Ambulatory Visit (HOSPITAL_BASED_OUTPATIENT_CLINIC_OR_DEPARTMENT_OTHER): Payer: Self-pay

## 2023-12-19 ENCOUNTER — Ambulatory Visit: Admitting: Family Medicine

## 2023-12-19 ENCOUNTER — Encounter: Payer: Self-pay | Admitting: Family Medicine

## 2023-12-19 VITALS — BP 116/70 | HR 79 | Temp 98.3°F | Ht 62.0 in | Wt 144.0 lb

## 2023-12-19 DIAGNOSIS — J40 Bronchitis, not specified as acute or chronic: Secondary | ICD-10-CM

## 2023-12-19 DIAGNOSIS — J329 Chronic sinusitis, unspecified: Secondary | ICD-10-CM

## 2023-12-19 DIAGNOSIS — J4541 Moderate persistent asthma with (acute) exacerbation: Secondary | ICD-10-CM | POA: Diagnosis not present

## 2023-12-19 DIAGNOSIS — J4540 Moderate persistent asthma, uncomplicated: Secondary | ICD-10-CM

## 2023-12-19 MED ORDER — DOXYCYCLINE HYCLATE 100 MG PO TABS
100.0000 mg | ORAL_TABLET | Freq: Two times a day (BID) | ORAL | 0 refills | Status: DC
  Filled 2023-12-19: qty 14, 7d supply, fill #0

## 2023-12-19 MED ORDER — PREDNISONE 20 MG PO TABS
20.0000 mg | ORAL_TABLET | Freq: Every day | ORAL | 0 refills | Status: DC
  Filled 2023-12-19: qty 7, 7d supply, fill #0

## 2023-12-19 MED ORDER — BUDESONIDE-FORMOTEROL FUMARATE 80-4.5 MCG/ACT IN AERO
2.0000 | INHALATION_SPRAY | Freq: Two times a day (BID) | RESPIRATORY_TRACT | 1 refills | Status: AC
  Filled 2023-12-19: qty 10.2, 30d supply, fill #0

## 2023-12-22 ENCOUNTER — Ambulatory Visit: Payer: Commercial Managed Care - PPO

## 2024-01-11 ENCOUNTER — Encounter: Payer: Self-pay | Admitting: Family Medicine

## 2024-01-12 ENCOUNTER — Other Ambulatory Visit: Payer: Self-pay

## 2024-01-12 ENCOUNTER — Other Ambulatory Visit (HOSPITAL_BASED_OUTPATIENT_CLINIC_OR_DEPARTMENT_OTHER): Payer: Self-pay

## 2024-01-12 MED ORDER — RIZATRIPTAN BENZOATE 10 MG PO TABS
10.0000 mg | ORAL_TABLET | ORAL | 3 refills | Status: AC | PRN
  Filled 2024-01-12: qty 10, 20d supply, fill #0
  Filled 2024-03-16: qty 10, 20d supply, fill #1

## 2024-04-26 ENCOUNTER — Other Ambulatory Visit: Payer: Self-pay | Admitting: Family Medicine

## 2024-04-26 ENCOUNTER — Other Ambulatory Visit (HOSPITAL_BASED_OUTPATIENT_CLINIC_OR_DEPARTMENT_OTHER): Payer: Self-pay

## 2024-04-26 DIAGNOSIS — J4541 Moderate persistent asthma with (acute) exacerbation: Secondary | ICD-10-CM

## 2024-04-26 MED ORDER — ALBUTEROL SULFATE HFA 108 (90 BASE) MCG/ACT IN AERS
2.0000 | INHALATION_SPRAY | Freq: Four times a day (QID) | RESPIRATORY_TRACT | 2 refills | Status: DC | PRN
  Filled 2024-04-26: qty 13.4, 50d supply, fill #0
  Filled 2024-06-12: qty 13.4, 50d supply, fill #1

## 2024-05-01 ENCOUNTER — Ambulatory Visit: Admitting: Family Medicine

## 2024-05-01 ENCOUNTER — Encounter: Payer: Self-pay | Admitting: Family Medicine

## 2024-05-01 ENCOUNTER — Other Ambulatory Visit (HOSPITAL_BASED_OUTPATIENT_CLINIC_OR_DEPARTMENT_OTHER): Payer: Self-pay

## 2024-05-01 VITALS — BP 108/62 | HR 80 | Ht 62.0 in | Wt 161.5 lb

## 2024-05-01 DIAGNOSIS — J4540 Moderate persistent asthma, uncomplicated: Secondary | ICD-10-CM | POA: Diagnosis not present

## 2024-05-01 DIAGNOSIS — F330 Major depressive disorder, recurrent, mild: Secondary | ICD-10-CM | POA: Diagnosis not present

## 2024-05-01 DIAGNOSIS — Z1159 Encounter for screening for other viral diseases: Secondary | ICD-10-CM | POA: Diagnosis not present

## 2024-05-01 DIAGNOSIS — Z23 Encounter for immunization: Secondary | ICD-10-CM

## 2024-05-01 DIAGNOSIS — G43109 Migraine with aura, not intractable, without status migrainosus: Secondary | ICD-10-CM | POA: Diagnosis not present

## 2024-05-01 DIAGNOSIS — F411 Generalized anxiety disorder: Secondary | ICD-10-CM | POA: Diagnosis not present

## 2024-05-01 DIAGNOSIS — J4541 Moderate persistent asthma with (acute) exacerbation: Secondary | ICD-10-CM

## 2024-05-01 DIAGNOSIS — Z5181 Encounter for therapeutic drug level monitoring: Secondary | ICD-10-CM

## 2024-05-01 MED ORDER — PREDNISONE 20 MG PO TABS
40.0000 mg | ORAL_TABLET | Freq: Every day | ORAL | 0 refills | Status: AC
  Filled 2024-05-01: qty 10, 5d supply, fill #0

## 2024-05-01 NOTE — Assessment & Plan Note (Signed)
 Moderate persistent asthma with acute exacerbation Asthma exacerbation likely due to seasonal changes and increased mold exposure. Symptoms include chest tightness and frequent albuterol  use. Inconsistent adherence to budesonide -formoterol . - Initiate prednisone  for 5 days. - Instruct consistent use of budesonide -formoterol  twice daily. - Advise monitoring symptoms and contact if no improvement after 10-14 days or if symptoms worsen. - Check expiration date on budesonide -formoterol  inhaler.

## 2024-05-01 NOTE — Progress Notes (Signed)
 Established Patient Office Visit  Patient ID: Shannon Bishop, female    DOB: 04-30-2001  Age: 23 y.o. MRN: [id number] PCP: Alvan Dorothyann BIRCH, MD  Chief Complaint  Patient presents with   Medical Management of Chronic Issues    Subjective:     HPI  Discussed the use of AI scribe software for clinical note transcription with the patient, who gave verbal consent to proceed.  History of Present Illness Shannon Bishop is a 23 year old female with asthma who presents with increased use of her rescue inhaler and nighttime symptoms.  Asthma symptoms - Increased use of albuterol  inhaler, nearly daily - Chest tightness, especially at night - No wheezing or mucus production - Nocturnal symptoms with awakening due to shortness of breath three to four times in the past week - Inconsistent use of budesonide  inhaler, typically only in the morning  Migraine headaches - Migraine episodes approximately once per week, with three to four episodes in the last four weeks - Still identifying migraine triggers - Uses Maxalt  as needed for acute episodes - Inconsistent use of Topamax , often forgetting nighttime doses  Mood has been good ovrerall. Happy with current regimen.      ROS    Objective:     BP 108/62   Pulse 80   Ht 5' 2 (1.575 m)   Wt 161 lb 8 oz (73.3 kg)   LMP 04/16/2024 (Approximate)   SpO2 98%   BMI 29.54 kg/m    Physical Exam Vitals and nursing note reviewed.  Constitutional:      Appearance: Normal appearance.  HENT:     Head: Normocephalic and atraumatic.  Eyes:     Conjunctiva/sclera: Conjunctivae normal.  Cardiovascular:     Rate and Rhythm: Normal rate and regular rhythm.  Pulmonary:     Effort: Pulmonary effort is normal.     Breath sounds: Wheezing present.     Comments: Wheezing at the left lower lung base.   Skin:    General: Skin is warm and dry.  Neurological:     Mental Status: She is alert.  Psychiatric:        Mood and Affect:  Mood normal.      No results found for any visits on 05/01/24.    The ASCVD Risk score (Arnett DK, et al., 2019) failed to calculate for the following reasons:   The 2019 ASCVD risk score is only valid for ages 34 to 39    Assessment & Plan:   Problem List Items Addressed This Visit       Cardiovascular and Mediastinum   Migraine with aura and without status migrainosus, not intractable   Migraine with aura Experiencing weekly migraines, possibly triggered by environmental factors. Inconsistent Topamax  use. Discussed prophylactic options if migraines persist. - Encourage consistent Topamax  use to assess effectiveness. - Monitor migraine frequency over the next month or two. - Discuss prophylactic options if migraines remain frequent.      Relevant Orders   CMP14+EGFR   Lipid panel   CBC   Hepatitis C Antibody     Respiratory   Moderate persistent asthma with exacerbation   Moderate persistent asthma with acute exacerbation Asthma exacerbation likely due to seasonal changes and increased mold exposure. Symptoms include chest tightness and frequent albuterol  use. Inconsistent adherence to budesonide -formoterol . - Initiate prednisone  for 5 days. - Instruct consistent use of budesonide -formoterol  twice daily. - Advise monitoring symptoms and contact if no improvement after 10-14 days or if symptoms worsen. -  Check expiration date on budesonide -formoterol  inhaler.      Relevant Medications   predniSONE  (DELTASONE ) 20 MG tablet   Asthma in adult, moderate persistent, uncomplicated   Relevant Medications   predniSONE  (DELTASONE ) 20 MG tablet   Other Relevant Orders   CMP14+EGFR   Lipid panel   CBC   Hepatitis C Antibody     Other   MDD (major depressive disorder), recurrent episode, mild - Primary   Flowsheet Row Office Visit from 10/31/2023 in [facility] at [facility]  PHQ-9 Total Score 4   Doing well. Continue current  regimen. F/U in 4-6 months.        Generalized anxiety disorder   Other Visit Diagnoses       Encounter for immunization       Relevant Orders   Flu vaccine trivalent PF, 6mos and older(Flulaval,Afluria,Fluarix,Fluzone) (Completed)   Pneumococcal conjugate vaccine 20-valent (Completed)     Medication monitoring encounter       Relevant Orders   CMP14+EGFR   Lipid panel   CBC   Hepatitis C Antibody     Encounter for hepatitis C screening test for low risk patient       Relevant Orders   Hepatitis C Antibody       Assessment and Plan Assessment & Plan   General Health Maintenance No recent blood work in the past year. - Order blood work to be done at her convenience before January.    Return in about 6 months (around 10/30/2024) for migraines and mood.    Dorothyann Byars, MD [facility] at [facility]

## 2024-05-01 NOTE — Assessment & Plan Note (Signed)
 Migraine with aura Experiencing weekly migraines, possibly triggered by environmental factors. Inconsistent Topamax  use. Discussed prophylactic options if migraines persist. - Encourage consistent Topamax  use to assess effectiveness. - Monitor migraine frequency over the next month or two. - Discuss prophylactic options if migraines remain frequent.

## 2024-05-01 NOTE — Assessment & Plan Note (Signed)
 Flowsheet Row Office Visit from 10/31/2023 in [facility] at [facility]  PHQ-9 Total Score 4   Doing well. Continue current regimen. F/U in 4-6 months.

## 2024-05-08 DIAGNOSIS — G43109 Migraine with aura, not intractable, without status migrainosus: Secondary | ICD-10-CM | POA: Diagnosis not present

## 2024-05-08 DIAGNOSIS — Z1159 Encounter for screening for other viral diseases: Secondary | ICD-10-CM | POA: Diagnosis not present

## 2024-05-08 DIAGNOSIS — J4540 Moderate persistent asthma, uncomplicated: Secondary | ICD-10-CM | POA: Diagnosis not present

## 2024-05-08 DIAGNOSIS — Z5181 Encounter for therapeutic drug level monitoring: Secondary | ICD-10-CM | POA: Diagnosis not present

## 2024-05-09 LAB — CMP14+EGFR
ALT: 14 IU/L (ref 0–32)
AST: 15 IU/L (ref 0–40)
Albumin: 4.4 g/dL (ref 4.0–5.0)
Alkaline Phosphatase: 57 IU/L (ref 41–116)
BUN/Creatinine Ratio: 10 (ref 9–23)
BUN: 8 mg/dL (ref 6–20)
Bilirubin Total: 0.6 mg/dL (ref 0.0–1.2)
CO2: 21 mmol/L (ref 20–29)
Calcium: 9.6 mg/dL (ref 8.7–10.2)
Chloride: 101 mmol/L (ref 96–106)
Creatinine, Ser: 0.82 mg/dL (ref 0.57–1.00)
Globulin, Total: 2.5 g/dL (ref 1.5–4.5)
Glucose: 88 mg/dL (ref 70–99)
Potassium: 4.3 mmol/L (ref 3.5–5.2)
Sodium: 140 mmol/L (ref 134–144)
Total Protein: 6.9 g/dL (ref 6.0–8.5)
eGFR: 102 mL/min/1.73 (ref >59)

## 2024-05-09 LAB — CBC
Hematocrit: 41.5 % (ref 34.0–46.6)
Hemoglobin: 13.7 g/dL (ref 11.1–15.9)
MCH: 31.3 pg (ref 26.6–33.0)
MCHC: 33 g/dL (ref 31.5–35.7)
MCV: 95 fL (ref 79–97)
Platelets: 315 x10E3/uL (ref 150–450)
RBC: 4.38 x10E6/uL (ref 3.77–5.28)
RDW: 12.4 % (ref 11.7–15.4)
WBC: 8.1 x10E3/uL (ref 3.4–10.8)

## 2024-05-09 LAB — LIPID PANEL
Chol/HDL Ratio: 2.8 ratio (ref 0.0–4.4)
Cholesterol, Total: 171 mg/dL (ref 100–199)
HDL: 62 mg/dL (ref >39)
LDL Chol Calc (NIH): 99 mg/dL (ref 0–99)
Triglycerides: 49 mg/dL (ref 0–149)
VLDL Cholesterol Cal: 10 mg/dL (ref 5–40)

## 2024-05-09 LAB — HEPATITIS C ANTIBODY: Hep C Virus Ab: NONREACTIVE

## 2024-05-14 ENCOUNTER — Ambulatory Visit: Payer: Self-pay | Admitting: Family Medicine

## 2024-05-14 NOTE — Progress Notes (Signed)
 Hi Shannon Bishop, cholesterol and blood count look good.  Negative for hepatitis C.  Metabolic panel including liver and kidney look great.

## 2024-06-13 ENCOUNTER — Other Ambulatory Visit (HOSPITAL_BASED_OUTPATIENT_CLINIC_OR_DEPARTMENT_OTHER): Payer: Self-pay

## 2024-06-13 MED ORDER — SODIUM FLUORIDE 5000 PPM 1.1 % DT PSTE
1.0000 | PASTE | Freq: Two times a day (BID) | DENTAL | 3 refills | Status: AC
  Filled 2024-06-13: qty 100, 30d supply, fill #0

## 2024-06-19 ENCOUNTER — Encounter: Payer: Self-pay | Admitting: Family Medicine

## 2024-06-19 ENCOUNTER — Ambulatory Visit: Payer: Self-pay

## 2024-06-19 ENCOUNTER — Other Ambulatory Visit (HOSPITAL_BASED_OUTPATIENT_CLINIC_OR_DEPARTMENT_OTHER): Payer: Self-pay

## 2024-06-19 ENCOUNTER — Ambulatory Visit: Admitting: Family Medicine

## 2024-06-19 VITALS — BP 101/67 | HR 81 | Temp 98.5°F | Resp 16 | Ht 66.0 in | Wt 161.1 lb

## 2024-06-19 DIAGNOSIS — J4541 Moderate persistent asthma with (acute) exacerbation: Secondary | ICD-10-CM

## 2024-06-19 MED ORDER — ALBUTEROL SULFATE (2.5 MG/3ML) 0.083% IN NEBU
2.5000 mg | INHALATION_SOLUTION | Freq: Four times a day (QID) | RESPIRATORY_TRACT | 2 refills | Status: AC | PRN
  Filled 2024-06-19: qty 75, 7d supply, fill #0

## 2024-06-19 MED ORDER — METHYLPREDNISOLONE SODIUM SUCC 125 MG IJ SOLR
125.0000 mg | Freq: Once | INTRAMUSCULAR | Status: AC
  Administered 2024-06-19: 16:00:00 125 mg via INTRAMUSCULAR

## 2024-06-19 MED ORDER — AIRSUPRA 90-80 MCG/ACT IN AERO
2.0000 | INHALATION_SPRAY | Freq: Four times a day (QID) | RESPIRATORY_TRACT | 11 refills | Status: AC | PRN
  Filled 2024-06-19: qty 10.7, 30d supply, fill #0

## 2024-06-19 NOTE — Telephone Encounter (Signed)
 FYI Only or Action Required?: FYI only for provider: ED advised.  Patient was last seen in primary care on 05/01/2024 by Shannon Dorothyann BIRCH, MD.  Called Nurse Triage reporting Wheezing.  Symptoms began today.  Interventions attempted: OTC medications: Albuterol  inhaler and Prescription medications: Prednisone .  Symptoms are: unchanged.  Triage Disposition: Go to ED Now (or PCP Triage)  Patient/caregiver understands and will follow disposition?: Yes  Reason for Disposition  Quick-relief asthma medicine (e.Bishop., albuterol  /salbutamol, levalbuterol by inhaler or nebulizer) is needed more frequently than every 4 hours to keep you comfortable  Answer Assessment - Initial Assessment Questions Patient calls in stating she woke up and wasn't able to breathe. She has been wheezing with mild SOB, cough, and lung tightness. She states that she has been using her rescue inhaler every hour with little relief. She also states that she had a Prednisone  pill that she took about two hours ago, but also feels that has not provided much relief. She mentions that she has a nebulizer, but does not have the medication for it. ED advised.   1. RESPIRATORY STATUS: Describe your breathing? (e.Bishop., wheezing, shortness of breath, unable to speak, severe coughing)      Wheezing, mild SOB  2. ONSET: When did this asthma attack begin?      This morning-patient states she couldn't breathe when she woke up  3. TRIGGER: What do you think triggered this attack? (e.Bishop., URI, exposure to pollen or other allergen, tobacco smoke)      Unsure  4. PEAK EXPIRATORY FLOW RATE (PEFR): Do you use a peak flow meter? If Yes, ask: What's the current peak flow? What's your personal best peak flow?      Unknown  5. SEVERITY: How bad is this attack?      Mild  6. ASTHMA MEDICINES:  What treatments have you tried?      Rescue inhaler, States she took a Prednisone  2 hours ago  7. INHALED QUICK-RELIEF TREATMENTS FOR  THIS ATTACK: What treatments have you given yourself so far? and How many and how often? If using an inhaler, ask, How many puffs? Note: Routine treatments are 2 puffs every 4 hours as needed. Rescue treatments are 4 puffs repeated every 20 minutes, up to three times as needed.      Albuterol  inhaler every hour  8. OTHER SYMPTOMS: Do you have any other symptoms? (e.Bishop., chest pain, coughing up yellow sputum, fever, runny nose)     Chest tightness, Coughing-clear mucus  9. O2 SATURATION MONITOR:  Do you use an oxygen saturation monitor (pulse oximeter) at home? If Yes, What is your reading (oxygen level) today? What is your usual oxygen saturation reading? (e.Bishop., 95%)     No  10. PREGNANCY: Is there any chance you are pregnant? When was your last menstrual period?       No  Protocols used: Asthma Attack-A-AH  Copied from CRM #[id number]. Topic: Clinical - Red Word Triage >> Jun 19, 2024 11:58 AM Shannon Bishop wrote: [facility] that prompted transfer to Nurse Triage: asthma ( wheezing ) , lungs are tight

## 2024-06-19 NOTE — Progress Notes (Signed)
 Shannon Bishop - 23 y.o. female MRN [id number]  Date of birth: 2000-09-24  Subjective Chief Complaint  Patient presents with   Sinusitis    HPI Shannon Bishop is a 23 y.o. female here today with complaint of cough, wheezing and tightness in her chest.  History of asthma and feels similar ot previous asthma flares.  Using symbicort  regularly.  She is also using albuterol  but doesn't feel that she is getting good relief with this.  She denies fever or chills.  She has required steroids for her asthma flares in the past.    ROS:  A comprehensive ROS was completed and negative except as noted per HPI  Allergies[1]  Past Medical History:  Diagnosis Date   ADHD (attention deficit hyperactivity disorder)    Anxiety    Asthma    Depression    Environmental allergies     History reviewed. No pertinent surgical history.  Social History   Socioeconomic History   Marital status: Single    Spouse name: Not on file   Number of children: Not on file   Years of education: Not on file   Highest education level: Not on file  Occupational History   Not on file  Tobacco Use   Smoking status: Never   Smokeless tobacco: Never  Vaping Use   Vaping status: Every Day   Substances: Nicotine, Flavoring  Substance and Sexual Activity   Alcohol use: No   Drug use: No   Sexual activity: Not Currently  Other Topics Concern   Not on file  Social History Narrative   Not on file   Social Drivers of Health   Tobacco Use: Low Risk (06/19/2024)   Patient History    Smoking Tobacco Use: Never    Smokeless Tobacco Use: Never    Passive Exposure: Not on file  Financial Resource Strain: Not on file  Food Insecurity: Not on file  Transportation Needs: Not on file  Physical Activity: Not on file  Stress: Not on file  Social Connections: Not on file  Depression (PHQ2-9): Low Risk (10/31/2023)   Depression (PHQ2-9)    PHQ-2 Score: 4  Alcohol Screen: Not on file  Housing: Not on file   Utilities: Not on file  Health Literacy: Not on file    Family History  Problem Relation Age of Onset   Asthma Mother    Irritable bowel syndrome Mother    Hypertension Maternal Grandmother    Heart disease Maternal Grandmother    Cancer Maternal Grandmother    Irritable bowel syndrome Sister     Health Maintenance  Topic Date Due   HIV Screening  Never done   COVID-19 Vaccine (3 - 2025-26 season) 07/05/2024 (Originally 03/04/2024)   DTaP/Tdap/Td (8 - Td or Tdap) 07/18/2025   Cervical Cancer Screening (Pap smear)  05/01/2026   Pneumococcal Vaccine  Completed   Influenza Vaccine  Completed   Hepatitis B Vaccines 19-59 Average Risk  Completed   HPV VACCINES  Completed   Hepatitis C Screening  Completed   Meningococcal B Vaccine  Aged Out     ----------------------------------------------------------------------------------------------------------------------------------------------------------------------------------------------------------------- Physical Exam BP 101/67 (BP Location: Left Arm, Patient Position: Sitting)   Pulse 81   Temp 98.5 F (36.9 C) (Oral)   Resp 16   Ht 5' 6 (1.676 m)   Wt 161 lb 1.9 oz (73.1 kg)   SpO2 97%   BMI 26.01 kg/m   Physical Exam Constitutional:      Appearance: Normal appearance.  Eyes:  General: No scleral icterus. Cardiovascular:     Rate and Rhythm: Normal rate and regular rhythm.  Pulmonary:     Effort: Pulmonary effort is normal.     Breath sounds: Normal breath sounds.  Musculoskeletal:     Cervical back: Neck supple.  Neurological:     Mental Status: She is alert.  Psychiatric:        Mood and Affect: Mood normal.        Behavior: Behavior normal.     ------------------------------------------------------------------------------------------------------------------------------------------------------------------------------------------------------------------- Assessment and Plan  Moderate persistent asthma  with exacerbation Injection of solu-medrol  125mg  given in office.  Albuterol  nebulizer solution renewed.  Continue symbicort  daily.  Will change albuterol  to AirSupra .  Red flags reviewed.  Follow up if not improving.    Meds ordered this encounter  Medications   methylPREDNISolone  sodium succinate (SOLU-MEDROL ) 125 mg/2 mL injection 125 mg   Albuterol -Budesonide  (AIRSUPRA ) 90-80 MCG/ACT AERO    Sig: Inhale 2 puffs into the lungs every 6 (six) hours as needed.    Dispense:  1 g    Refill:  11    Please dispense #1 inhaler   albuterol  (PROVENTIL ) (2.5 MG/3ML) 0.083% nebulizer solution    Sig: Take 3 mLs (2.5 mg total) by nebulization every 6 (six) hours as needed for wheezing or shortness of breath (please include nebulizer machine, hoses, and mask if needed.).    Dispense:  60 mL    Refill:  2    No follow-ups on file.        [1]  Allergies Allergen Reactions   Amoxicillin Rash   Trintellix  [Vortioxetine ] Nausea And Vomiting

## 2024-06-19 NOTE — Telephone Encounter (Signed)
 FYI - the patient is scheduled to see Dr. Alvia on 06/19/24 at 330 pm for the following symptoms.

## 2024-06-19 NOTE — Assessment & Plan Note (Signed)
 Injection of solu-medrol  125mg  given in office.  Albuterol  nebulizer solution renewed.  Continue symbicort  daily.  Will change albuterol  to AirSupra .  Red flags reviewed.  Follow up if not improving.

## 2024-06-19 NOTE — Patient Instructions (Signed)
 Try Airsupra  to replace albuterol   Asthma, Adult  Asthma is a long-term (chronic) condition that causes recurrent episodes in which the lower airways in the lungs become tight and narrow. The narrowing is caused by inflammation and tightening of the smooth muscle around the lower airways. Asthma episodes, also called asthma attacks or asthma flares, may cause coughing, making high-pitched whistling sounds when you breathe, most often when you breathe out (wheezing), shortness of breath, and chest pain. The airways may produce extra mucus caused by the inflammation and irritation. During an attack, it can be difficult to breathe. Asthma attacks can range from minor to life-threatening. Asthma cannot be cured, but medicines and lifestyle changes can help control it and treat acute attacks. It is important to keep your asthma well controlled so the condition does not interfere with your daily life. What are the causes? This condition is believed to be caused by inherited (genetic) and environmental factors, but its exact cause is not known. What can trigger an asthma attack? Many things can bring on an asthma attack or make symptoms worse. These triggers are different for every person. Common triggers include: Allergens and irritants like mold, dust, pet dander, cockroaches, pollen, air pollution, and chemical odors. Cigarette smoke. Weather changes and cold air. Stress and strong emotional responses such as crying or laughing hard. Certain medications such as aspirin or beta blockers. Infections and inflammatory conditions, such as the flu, a cold, pneumonia, or inflammation of the nasal membranes (rhinitis). Gastroesophageal reflux disease (GERD). What are the signs or symptoms? Symptoms may occur right after exposure to an asthma trigger or hours later and can vary by person. Common signs and symptoms include: Wheezing. Trouble breathing (shortness of breath). Excessive nighttime or early  morning coughing. Chest tightness. Tiredness (fatigue) with minimal activity. Difficulty talking in complete sentences. Poor exercise tolerance. How is this diagnosed? This condition is diagnosed based on: A physical exam and your medical history. Tests, which may include: Lung function studies to evaluate the flow of air in your lungs. Allergy tests. Imaging tests, such as X-rays. How is this treated? There is no cure, but symptoms can be controlled with proper treatment. Treatment usually involves: Identifying and avoiding your asthma triggers. Inhaled medicines. Two types are commonly used to treat asthma, depending on severity: Controller medicines. These help prevent asthma symptoms from occurring. They are taken every day. Fast-acting reliever or rescue medicines. These quickly relieve asthma symptoms. They are used as needed and provide short-term relief. Using other medicines, such as: Allergy medicines, such as antihistamines, if your asthma attacks are triggered by allergens. Immune medicines (immunomodulators). These are medicines that help control the immune system. Using supplemental oxygen. This is only needed during a severe episode. Creating an asthma action plan. An asthma action plan is a written plan for managing and treating your asthma attacks. This plan includes: A list of your asthma triggers and how to avoid them. Information about when medicines should be taken and when their dosage should be changed. Instructions about using a device called a peak flow meter. A peak flow meter measures how well the lungs are working and the severity of your asthma. It helps you monitor your condition. Follow these instructions at home: Take over-the-counter and prescription medicines only as told by your health care provider. Stay up to date on all vaccinations as recommended by your healthcare provider, including vaccines for the flu and pneumonia. Use a peak flow meter and  keep track of your peak flow  readings. Understand and use your asthma action plan to address any asthma flares. Do not smoke or allow anyone to smoke in your home. Contact a health care provider if: You have wheezing, shortness of breath, or a cough that is not responding to medicines. Your medicines are causing side effects, such as a rash, itching, swelling, or trouble breathing. You need to use a reliever medicine more than 2-3 times a week. Your peak flow reading is still at 50-79% of your personal best after following your action plan for 1 hour. You have a fever and shortness of breath. Get help right away if: You are getting worse and do not respond to treatment during an asthma attack. You are short of breath when at rest or when doing very little physical activity. You have difficulty eating, drinking, or talking. You have chest pain or tightness. You develop a fast heartbeat or palpitations. You have a bluish color to your lips or fingernails. You are light-headed or dizzy, or you faint. Your peak flow reading is less than 50% of your personal best. You feel too tired to breathe normally. These symptoms may be an emergency. Get help right away. Call 911. Do not wait to see if the symptoms will go away. Do not drive yourself to the hospital. Summary Asthma is a long-term (chronic) condition that causes recurrent episodes in which the airways become tight and narrow. Asthma episodes, also called asthma attacks or asthma flares, can cause coughing, wheezing, shortness of breath, and chest pain. Asthma cannot be cured, but medicines and lifestyle changes can help keep it well controlled and prevent asthma flares. Make sure you understand how to avoid triggers and how and when to use your medicines. Asthma attacks can range from minor to life-threatening. Get help right away if you have an asthma attack and do not respond to treatment with your usual rescue medicines. This information  is not intended to replace advice given to you by your health care provider. Make sure you discuss any questions you have with your health care provider. Document Revised: 04/07/2021 Document Reviewed: 03/29/2021 Elsevier Patient Education  2024 [employer].

## 2024-06-19 NOTE — Telephone Encounter (Signed)
 Patient is coming in this afternoon to see Dr. Alvia. Dr. Alvia approved this

## 2024-10-30 ENCOUNTER — Ambulatory Visit: Admitting: Family Medicine
